# Patient Record
Sex: Female | Born: 1945
Health system: Southern US, Community
[De-identification: ages and names within clinical notes are randomized; demographics above are authoritative.]

## PROBLEM LIST (undated history)

## (undated) DIAGNOSIS — M8580 Other specified disorders of bone density and structure, unspecified site: Secondary | ICD-10-CM

## (undated) DIAGNOSIS — E079 Disorder of thyroid, unspecified: Secondary | ICD-10-CM

## (undated) DIAGNOSIS — M1990 Unspecified osteoarthritis, unspecified site: Secondary | ICD-10-CM

## (undated) DIAGNOSIS — Z720 Tobacco use: Secondary | ICD-10-CM

## (undated) HISTORY — DX: Other specified disorders of bone density and structure, unspecified site: M85.80

## (undated) HISTORY — PX: TUBAL LIGATION: SHX77

## (undated) HISTORY — DX: Unspecified osteoarthritis, unspecified site: M19.90

## (undated) HISTORY — DX: Disorder of thyroid, unspecified: E07.9

## (undated) HISTORY — PX: EYE SURGERY: SHX253

## (undated) HISTORY — DX: Tobacco use: Z72.0

## (undated) HISTORY — PX: TONSILLECTOMY AND ADENOIDECTOMY: SHX28

---

## 2004-08-03 ENCOUNTER — Ambulatory Visit: Payer: Self-pay | Admitting: Internal Medicine

## 2004-08-03 ENCOUNTER — Ambulatory Visit (HOSPITAL_COMMUNITY): Admission: RE | Admit: 2004-08-03 | Discharge: 2004-08-03 | Payer: Self-pay | Admitting: Internal Medicine

## 2004-08-08 ENCOUNTER — Ambulatory Visit: Payer: Self-pay | Admitting: Internal Medicine

## 2004-10-03 ENCOUNTER — Ambulatory Visit: Payer: Self-pay | Admitting: Internal Medicine

## 2004-10-05 ENCOUNTER — Ambulatory Visit: Payer: Self-pay | Admitting: Internal Medicine

## 2004-10-14 ENCOUNTER — Ambulatory Visit: Payer: Self-pay | Admitting: Internal Medicine

## 2004-11-17 ENCOUNTER — Ambulatory Visit: Payer: Self-pay | Admitting: Internal Medicine

## 2004-11-23 ENCOUNTER — Ambulatory Visit: Payer: Self-pay | Admitting: Internal Medicine

## 2005-01-16 ENCOUNTER — Ambulatory Visit: Payer: Self-pay | Admitting: Internal Medicine

## 2005-01-19 ENCOUNTER — Ambulatory Visit: Payer: Self-pay | Admitting: Internal Medicine

## 2005-02-16 ENCOUNTER — Ambulatory Visit: Payer: Self-pay | Admitting: Internal Medicine

## 2005-03-22 ENCOUNTER — Ambulatory Visit: Payer: Self-pay | Admitting: Internal Medicine

## 2005-04-10 HISTORY — PX: TOTAL HIP ARTHROPLASTY: SHX124

## 2005-08-17 ENCOUNTER — Ambulatory Visit: Payer: Self-pay | Admitting: Internal Medicine

## 2005-10-04 ENCOUNTER — Inpatient Hospital Stay (HOSPITAL_COMMUNITY): Admission: RE | Admit: 2005-10-04 | Discharge: 2005-10-08 | Payer: Self-pay | Admitting: Orthopedic Surgery

## 2006-05-30 ENCOUNTER — Ambulatory Visit: Payer: Self-pay | Admitting: Internal Medicine

## 2006-09-21 ENCOUNTER — Ambulatory Visit: Payer: Self-pay | Admitting: Internal Medicine

## 2006-11-29 ENCOUNTER — Ambulatory Visit: Payer: Self-pay | Admitting: Internal Medicine

## 2006-11-29 LAB — CONVERTED CEMR LAB
Potassium: 4.7 meq/L (ref 3.5–5.1)
TSH: 1.65 microintl units/mL (ref 0.35–5.50)

## 2007-02-28 DIAGNOSIS — M1990 Unspecified osteoarthritis, unspecified site: Secondary | ICD-10-CM | POA: Insufficient documentation

## 2007-02-28 DIAGNOSIS — M810 Age-related osteoporosis without current pathological fracture: Secondary | ICD-10-CM | POA: Insufficient documentation

## 2007-03-22 ENCOUNTER — Telehealth: Payer: Self-pay | Admitting: Internal Medicine

## 2007-03-29 ENCOUNTER — Ambulatory Visit: Payer: Self-pay | Admitting: Internal Medicine

## 2007-03-29 DIAGNOSIS — F17200 Nicotine dependence, unspecified, uncomplicated: Secondary | ICD-10-CM

## 2007-03-29 DIAGNOSIS — E039 Hypothyroidism, unspecified: Secondary | ICD-10-CM | POA: Insufficient documentation

## 2007-03-29 DIAGNOSIS — I10 Essential (primary) hypertension: Secondary | ICD-10-CM

## 2007-04-15 ENCOUNTER — Telehealth: Payer: Self-pay | Admitting: Internal Medicine

## 2007-04-18 ENCOUNTER — Ambulatory Visit (HOSPITAL_COMMUNITY): Admission: RE | Admit: 2007-04-18 | Discharge: 2007-04-18 | Payer: Self-pay | Admitting: Internal Medicine

## 2007-04-18 ENCOUNTER — Encounter: Payer: Self-pay | Admitting: Internal Medicine

## 2007-05-03 ENCOUNTER — Encounter: Admission: RE | Admit: 2007-05-03 | Discharge: 2007-05-03 | Payer: Self-pay | Admitting: Internal Medicine

## 2007-05-11 IMAGING — CR DG HIP 1V PORT*R*
1 series · 1 of 1 positions shown · non-contrast
Comparison: 08/03/05.

CLINICAL DATA: Post right total hip replacement.
 PORTABLE RIGHT HIP ? 1 VIEW:

[view not recorded]
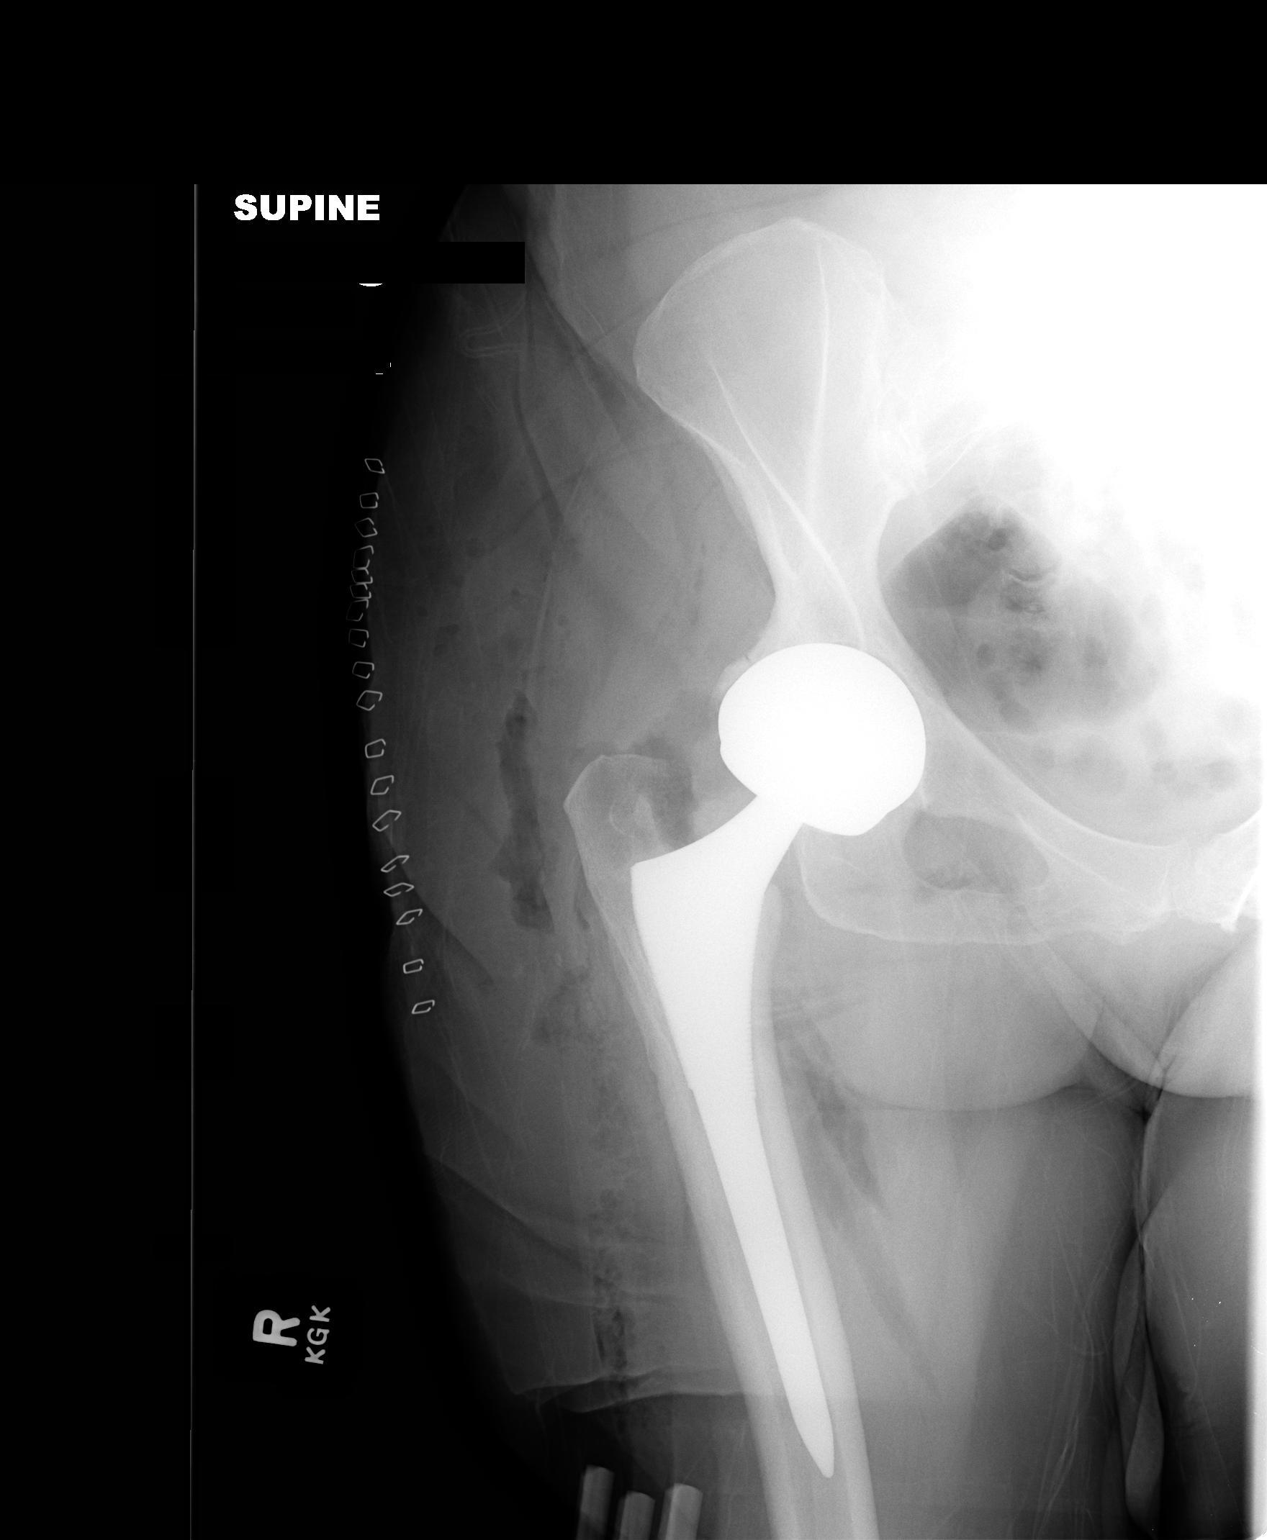

[1 of 1 positions shown; findings below may reference images not displayed]

FINDINGS: Status post right total hip replacement.  Good position and alignment of the prosthesis and surrounding bony structures, in the AP projection.
IMPRESSION: Post right total hip replacement ? good position and alignment in one view.

## 2007-06-20 ENCOUNTER — Telehealth: Payer: Self-pay | Admitting: Internal Medicine

## 2007-06-27 ENCOUNTER — Ambulatory Visit: Payer: Self-pay | Admitting: Internal Medicine

## 2007-06-27 DIAGNOSIS — E785 Hyperlipidemia, unspecified: Secondary | ICD-10-CM

## 2007-08-26 ENCOUNTER — Ambulatory Visit: Payer: Self-pay | Admitting: Internal Medicine

## 2008-01-28 ENCOUNTER — Encounter: Payer: Self-pay | Admitting: Internal Medicine

## 2008-02-17 ENCOUNTER — Ambulatory Visit: Payer: Self-pay | Admitting: Internal Medicine

## 2008-02-17 LAB — CONVERTED CEMR LAB
Cholesterol: 180 mg/dL (ref 0–200)
HDL: 62.5 mg/dL (ref >39.0)
Triglycerides: 105 mg/dL (ref 0–149)
VLDL: 21 mg/dL (ref 0–40)

## 2008-02-24 ENCOUNTER — Ambulatory Visit: Payer: Self-pay | Admitting: Internal Medicine

## 2008-04-22 ENCOUNTER — Ambulatory Visit: Payer: Self-pay | Admitting: Diagnostic Radiology

## 2008-04-22 ENCOUNTER — Ambulatory Visit: Payer: Self-pay | Admitting: Internal Medicine

## 2008-04-22 ENCOUNTER — Ambulatory Visit (HOSPITAL_BASED_OUTPATIENT_CLINIC_OR_DEPARTMENT_OTHER): Admission: RE | Admit: 2008-04-22 | Discharge: 2008-04-22 | Payer: Self-pay | Admitting: Internal Medicine

## 2008-04-23 ENCOUNTER — Encounter: Payer: Self-pay | Admitting: Internal Medicine

## 2008-06-23 ENCOUNTER — Ambulatory Visit: Payer: Self-pay | Admitting: Internal Medicine

## 2008-06-23 DIAGNOSIS — L259 Unspecified contact dermatitis, unspecified cause: Secondary | ICD-10-CM | POA: Insufficient documentation

## 2008-07-16 ENCOUNTER — Ambulatory Visit: Payer: Self-pay | Admitting: *Deleted

## 2008-07-17 ENCOUNTER — Telehealth (INDEPENDENT_AMBULATORY_CARE_PROVIDER_SITE_OTHER): Payer: Self-pay | Admitting: *Deleted

## 2008-07-22 ENCOUNTER — Ambulatory Visit: Payer: Self-pay | Admitting: Internal Medicine

## 2008-08-20 ENCOUNTER — Telehealth: Payer: Self-pay | Admitting: Internal Medicine

## 2008-09-21 ENCOUNTER — Encounter: Payer: Self-pay | Admitting: Internal Medicine

## 2008-12-24 ENCOUNTER — Ambulatory Visit: Payer: Self-pay | Admitting: Internal Medicine

## 2008-12-24 LAB — CONVERTED CEMR LAB: TSH: 1.112 microintl units/mL (ref 0.350–4.500)

## 2008-12-25 ENCOUNTER — Telehealth: Payer: Self-pay | Admitting: Internal Medicine

## 2009-06-10 ENCOUNTER — Ambulatory Visit: Payer: Self-pay | Admitting: Internal Medicine

## 2009-06-10 LAB — CONVERTED CEMR LAB
AST: 17 units/L (ref 0–37)
Bilirubin, Direct: 0.1 mg/dL (ref 0.0–0.3)
CO2: 23 meq/L (ref 19–32)
Calcium: 9.8 mg/dL (ref 8.4–10.5)
Creatinine, Ser: 0.83 mg/dL (ref 0.40–1.20)
Glucose, Bld: 129 mg/dL — ABNORMAL HIGH (ref 70–99)
Indirect Bilirubin: 0.6 mg/dL (ref 0.0–0.9)
LDL Cholesterol: 127 mg/dL — ABNORMAL HIGH (ref 0–99)
Sodium: 139 meq/L (ref 135–145)
TSH: 1.758 microintl units/mL (ref 0.350–4.500)
Total CHOL/HDL Ratio: 3.5

## 2009-06-15 ENCOUNTER — Ambulatory Visit: Payer: Self-pay | Admitting: Internal Medicine

## 2009-06-15 DIAGNOSIS — R7309 Other abnormal glucose: Secondary | ICD-10-CM | POA: Insufficient documentation

## 2009-06-23 ENCOUNTER — Encounter: Payer: Self-pay | Admitting: Internal Medicine

## 2009-09-13 ENCOUNTER — Ambulatory Visit (HOSPITAL_BASED_OUTPATIENT_CLINIC_OR_DEPARTMENT_OTHER): Admission: RE | Admit: 2009-09-13 | Discharge: 2009-09-13 | Payer: Self-pay | Admitting: Internal Medicine

## 2009-09-13 ENCOUNTER — Ambulatory Visit: Payer: Self-pay | Admitting: Diagnostic Radiology

## 2009-12-29 ENCOUNTER — Telehealth: Payer: Self-pay | Admitting: Internal Medicine

## 2010-01-12 ENCOUNTER — Encounter: Payer: Self-pay | Admitting: Internal Medicine

## 2010-01-12 LAB — CONVERTED CEMR LAB
BUN: 10 mg/dL (ref 6–23)
Calcium: 10 mg/dL (ref 8.4–10.5)
Cholesterol: 186 mg/dL (ref 0–200)
Potassium: 5 meq/L (ref 3.5–5.3)
Triglycerides: 94 mg/dL (ref <150)
VLDL: 19 mg/dL (ref 0–40)

## 2010-01-20 ENCOUNTER — Encounter: Payer: Self-pay | Admitting: Internal Medicine

## 2010-01-20 ENCOUNTER — Ambulatory Visit: Payer: Self-pay | Admitting: Internal Medicine

## 2010-05-01 ENCOUNTER — Encounter: Payer: Self-pay | Admitting: Internal Medicine

## 2010-05-10 NOTE — Letter (Signed)
Summary: [facility]: Immunoassay Fecal Occult Blood (iFOB) Order Psychologist, counselling at [facility]  [address]   [place], [place] [address]   Phone: [phone]  Fax: [phone]      [facility]: Immunoassay Fecal Occult Blood (iFOB) Order Form   January 20, 2010 MRN: [id number]   SHENIYA GARCIAPEREZ March 07, 1946   Physicican Name:Dr Thomos Lemons  Diagnosis Code:V70.0      Glendell Docker CMA  Appended Document: [facility]: Immunoassay Fecal Occult Blood (iFOB) Order Form test not performed

## 2010-05-10 NOTE — Assessment & Plan Note (Signed)
Summary: 3 MONTH FOLLOW UP APPT PAST DUE/DK   Vital Signs:  Patient profile:   65 year old female Height:      61 inches Weight:      138.75 pounds BMI:     26.31 O2 Sat:      99 % on Room air Temp:     98.1 degrees F oral Pulse rate:   73 / minute Pulse rhythm:   regular Resp:     16 per minute BP sitting:   120 / 80  (left arm) Cuff size:   regular  Vitals Entered By: Glendell Docker CMA (January 20, 2010 10:46 AM)  O2 Flow:  Room air  Contraindications/Deferment of Procedures/Staging:    Test/Procedure: FLU VAX    Reason for deferment: patient declined  CC: 3 Month Follow up Is Patient Diabetic? No Pain Assessment Patient in pain? no        Primary Care Terra Aveni:  Dondra Spry DO  CC:  3 Month Follow up.  History of Present Illness:  Hypertension Follow-Up      This is a 65 year old woman who presents for Hypertension follow-up.  The patient denies lightheadedness.  Compliance with medications (by patient report) has been near 100%.  The patient reports that dietary compliance has been fair.    Preventive Screening-Counseling & Management  Alcohol-Tobacco     Smoking Status: current     Smoking Cessation Counseling: yes     Smoke Cessation Stage: precontemplative  Allergies: No Known Drug Allergies  Past History:  Past Medical History: Osteoarthritis-Rt hip Osteopenia  Hypothyroidism     Tobacco Abuse    Past Surgical History: T&A Rt Hip replacement-2007 Tubal ligation        PMH reviewed for relevance  Family History: Adopted      Social History: Current Smoker Single - 4 children   Alcohol use-yes  (socially)    Lives alone Previously worked at [employer] 1  Physical Exam  General:  alert, well-developed, and well-nourished.   Ears:  R ear normal and L ear normal.   Lungs:  normal respiratory effort.  faint exp wheeze Heart:  normal rate, regular rhythm, and no gallop.   Extremities:  No lower extremity edema    Impression &  Recommendations:  Problem # 1:  HYPERTENSION (ICD-401.9) Assessment Unchanged  Her updated medication list for this problem includes:    Amlodipine Besylate 5 Mg Tabs (Amlodipine besylate) ..... One by mouth once daily  BP today: 120/80 Prior BP: 130/70 (06/15/2009)  Labs Reviewed: K+: 5.0 (01/12/2010) Creat: : 0.78 (01/12/2010)   Chol: 186 (01/12/2010)   HDL: 74 (01/12/2010)   LDL: 93 (01/12/2010)   TG: 94 (01/12/2010)  Problem # 2:  HYPOTHYROIDISM (ICD-244.9)  Her updated medication list for this problem includes:    Levothyroxine Sodium 100 Mcg Tabs (Levothyroxine sodium) ..... One by mouth once daily  Problem # 3:  DIABETES MELLITUS, TYPE II, BORDERLINE (ICD-790.29) Pt counseled on diet and exercise.  Labs Reviewed: Creat: 0.78 (01/12/2010)     Problem # 4:  TOBACCO ABUSE (ICD-305.1)  Encouraged smoking cessation and discussed different methods for smoking cessation.  time spent counseling pt - 5 mins  Problem # 5:  PREVENTIVE HEALTH CARE (ICD-V70.0)  Orders: Gastroenterology Referral (GI)  Complete Medication List: 1)  Levothyroxine Sodium 100 Mcg Tabs (Levothyroxine sodium) .... One by mouth once daily 2)  Amlodipine Besylate 5 Mg Tabs (Amlodipine besylate) .... One by mouth once daily 3)  Low-dose  Aspirin 81 Mg Tabs (Aspirin) .... Take 1 tablet by mouth once a day 4)  Multivitamins Tabs (Multiple vitamin) .... Take 1 tablet by mouth once a day 5)  [facility] Calcium-600 600 Mg Tabs (Calcium carbonate) .... Take 1 tablet by mouth once a day  Patient Instructions: 1)  Please schedule a follow-up appointment in 6 months. Prescriptions: AMLODIPINE BESYLATE 5 MG  TABS (AMLODIPINE BESYLATE) one by mouth once daily  #90 x 1   Entered and Authorized by:   D. Thomos Lemons DO   Signed by:   D. Thomos Lemons DO on 01/20/2010   Method used:   Electronically to        [facility]. [phone]* (retail)       [address], [place]  [address]       Ph: [phone]        Fax: [phone]   RxID:   [id number] LEVOTHYROXINE SODIUM 100 MCG TABS (LEVOTHYROXINE SODIUM) one by mouth once daily  #90 x 1   Entered and Authorized by:   D. Thomos Lemons DO   Signed by:   D. Thomos Lemons DO on 01/20/2010   Method used:   Electronically to        [facility]. [phone]* (retail)       [address], [place]  [address]       Ph: [phone]       Fax: [phone]   RxID:   [id number]    Preventive Care Screening  Last Flu Shot:    Date:  01/20/2010    Results:  Declined

## 2010-05-10 NOTE — Progress Notes (Signed)
Summary: Medication Refills  Phone Note Outgoing Call   Call placed by: Glendell Docker CMA,  December 29, 2009 11:35 AM Call placed to: Patient Action Taken: Appt scheduled Summary of Call: call placed to patient regarding medication refills. She was reminded that she is past due for  follow up with blood work. Patient has cancelled last 2 scheduled appointments. She states she was not aware that she was due for follow up. Appointment has been scheduled for 01/20/2010  with Dr Artist Pais @ 10:30 am. She was advised to return one week prior to scheduled appointment for fasting blood work. Patient has verbalized understanding and agrees. Refills on Amlodipine and Levothyroxine have been sent to pharmacy for one refill. Initial call taken by: Glendell Docker CMA,  December 29, 2009 11:39 AM     Appended Document: Orders Update    Clinical Lists Changes  Orders: Added new Test order of T-Basic Metabolic Panel [phone]) - Signed Added new Test order of T-Lipid Profile [id number]) - Signed Added new Test order of T- Hemoglobin A1C ([id number]) - Signed Added new Test order of T-TSH ([id number]) - Signed

## 2010-05-10 NOTE — Assessment & Plan Note (Signed)
Summary: Follow up/MHF   Vital Signs:  Patient profile:   65 year old female Height:      61 inches Weight:      147.13 pounds BMI:     27.90 O2 Sat:      100 % on Room air Temp:     97.9 degrees F oral Pulse rate:   77 / minute Pulse rhythm:   regular Resp:     18 per minute BP sitting:   130 / 70  (right arm) Cuff size:   regular  Vitals Entered By: Glendell Docker CMA (June 15, 2009 11:04 AM)  O2 Flow:  Room air CC: Follow up Is Patient Diabetic? No Comments Refill on levothyroxine and amlodipine   Primary Care Provider:  Dondra Spry DO  CC:  Follow up.  History of Present Illness: 65 y/o with hx htn, hypothyroidism and tob abuse for follow up some weight gain since prev visit not following low salt diet overall doing well  reviewed labs - glucose 129.  she was fasting.  fam hx unknown - she was adopted she likes carbs,  drinks a lot of cranberry cocktail juice  Preventive Screening-Counseling & Management  Alcohol-Tobacco     Alcohol drinks/day: 1-2 per week     Smoking Status: current  Caffeine-Diet-Exercise     Caffeine use/day: 4 -5 per day     Does Patient Exercise: no  Allergies (verified): No Known Drug Allergies  Past History:  Past Medical History: Osteoarthritis-Rt hip Osteopenia Hypothyroidism     Tobacco Abuse    Past Surgical History: T&A Rt Hip replacement-2007 Tubal ligation        Family History: Adopted    Social History: Current Smoker Single - 4 children   Alcohol use-yes  (socially)     Caffeine use/day:  4 -5 per day  Physical Exam  General:  alert, well-developed, and well-nourished.   Head:  normocephalic and atraumatic.   Ears:  R ear normal and L ear normal.   Mouth:  Oral mucosa and oropharynx without lesions or exudates.  Teeth in good repair. Neck:  supple and no masses.   Lungs:  normal respiratory effort and normal breath sounds.   Heart:  normal rate, regular rhythm, and no gallop.   Abdomen:   soft, non-tender, normal bowel sounds, no masses, no hepatomegaly, and no splenomegaly.   Extremities:  No lower extremity edema  Neurologic:  cranial nerves II-XII intact and gait normal.   Psych:  normally interactive and good eye contact.     Impression & Recommendations:  Problem # 1:  HYPERTENSION (ICD-401.9) stable. I encouraged lower salt intake  Her updated medication list for this problem includes:    Amlodipine Besylate 5 Mg Tabs (Amlodipine besylate) ..... One by mouth once daily  BP today: 130/70 Prior BP: 112/70 (12/24/2008)  Labs Reviewed: K+: 4.9 (06/10/2009) Creat: : 0.83 (06/10/2009)   Chol: 223 (06/10/2009)   HDL: 64 (06/10/2009)   LDL: 127 (06/10/2009)   TG: 160 (06/10/2009)  Problem # 2:  HYPOTHYROIDISM (ICD-244.9) Stable.  Maintain current medication regimen.  Her updated medication list for this problem includes:    Levothyroxine Sodium 100 Mcg Tabs (Levothyroxine sodium) ..... One by mouth once daily  Labs Reviewed: TSH: 1.758 (06/10/2009)    Chol: 223 (06/10/2009)   HDL: 64 (06/10/2009)   LDL: 127 (06/10/2009)   TG: 160 (06/10/2009)  Problem # 3:  DIABETES MELLITUS, TYPE II, BORDERLINE (ICD-790.29) Fasting blood sugar 129.  I encouraged diet and exercise.   A1c before next OV  Complete Medication List: 1)  Levothyroxine Sodium 100 Mcg Tabs (Levothyroxine sodium) .... One by mouth once daily 2)  Amlodipine Besylate 5 Mg Tabs (Amlodipine besylate) .... One by mouth once daily 3)  Low-dose Aspirin 81 Mg Tabs (Aspirin) .... Take 1 tablet by mouth once a day 4)  Multivitamins Tabs (Multiple vitamin) .... Take 1 tablet by mouth once a day 5)  [facility] Calcium-600 600 Mg Tabs (Calcium carbonate) .... Take 1 tablet by mouth once a day  Patient Instructions: 1)  Please schedule a follow-up appointment in 3 months. 2)  Avoid concentrated sweets and decrease carbohydrates 3)  Start walking program as discussed 4)  BMP prior to visit, ICD-9:  790.29 5)  HbgA1C  prior to visit, ICD-9: 790.29 6)  Lipid Panel prior to visit, ICD-9: 790.29 7)  Please return for lab work one (1) week before your next appointment.  Prescriptions: AMLODIPINE BESYLATE 5 MG  TABS (AMLODIPINE BESYLATE) one by mouth once daily  #30 x 5   Entered and Authorized by:   D. Thomos Lemons DO   Signed by:   D. Thomos Lemons DO on 06/15/2009   Method used:   Electronically to        [facility]. [phone]* (retail)       [address] [place], [place]  [address]       Ph: [phone]       Fax: [phone]   RxID:   [id number] LEVOTHYROXINE SODIUM 100 MCG TABS (LEVOTHYROXINE SODIUM) one by mouth once daily  #30 x 5   Entered and Authorized by:   D. Thomos Lemons DO   Signed by:   D. Thomos Lemons DO on 06/15/2009   Method used:   Electronically to        [facility]. [phone]* (retail)       [address], [place]  [address]       Ph: [phone]       Fax: [phone]   RxID:   [id number]    Immunization History:  Influenza Immunization History:    Influenza:  declined (06/15/2009)  Tetanus/Td Immunization History:    Tetanus/Td:  historical (06/20/2005)   Contraindications/Deferment of Procedures/Staging:    Test/Procedure: FLU VAX    Reason for deferment: patient declined    Current Allergies (reviewed today): No known allergies   Appended Document: Orders Update    Clinical Lists Changes  Orders: Added new Referral order of Nutrition Referral (Nutrition) - Signed

## 2010-05-10 NOTE — Letter (Signed)
Summary: [facility]  [facility]   Imported By: Lanelle Bal 08/20/2009 11:22:39  _____________________________________________________________________  External Attachment:    Type:   Image     Comment:   External Document

## 2010-07-11 ENCOUNTER — Ambulatory Visit: Payer: Self-pay | Admitting: Internal Medicine

## 2010-08-10 ENCOUNTER — Encounter: Payer: Self-pay | Admitting: Internal Medicine

## 2010-08-16 ENCOUNTER — Telehealth: Payer: Self-pay | Admitting: Internal Medicine

## 2010-08-16 NOTE — Telephone Encounter (Signed)
Call returned to patient at  [phone], she was informed that it was okay to

## 2010-08-16 NOTE — Telephone Encounter (Signed)
I called pt to remind her of her appt for tomorrow morning. Patient would like to know if she needs to take her thyroid med(levothyroxine) before her appt? Please assist.

## 2010-08-17 ENCOUNTER — Encounter: Payer: Self-pay | Admitting: Internal Medicine

## 2010-08-17 ENCOUNTER — Ambulatory Visit: Payer: Medicare Other | Admitting: Internal Medicine

## 2010-08-17 DIAGNOSIS — M949 Disorder of cartilage, unspecified: Secondary | ICD-10-CM

## 2010-08-17 DIAGNOSIS — E039 Hypothyroidism, unspecified: Secondary | ICD-10-CM

## 2010-08-17 DIAGNOSIS — I10 Essential (primary) hypertension: Secondary | ICD-10-CM

## 2010-08-17 DIAGNOSIS — M899 Disorder of bone, unspecified: Secondary | ICD-10-CM

## 2010-08-17 NOTE — Progress Notes (Signed)
  Subjective:    Patient ID: Jill Armstrong, female    DOB: 1945-09-19, 65 y.o.   MRN: [id number]  HPI  65 y/o white female with hx of hypothyroidism, Review of Systems     Past Medical History  Diagnosis Date  . Arthritis     osteoarthritis right hip  . Osteopenia   . Tobacco abuse   . Thyroid disease     hypothyroidism    History   Social History  . Marital Status: Divorced    Spouse Name: N/A    Number of Children: 4  . Years of Education: N/A   Occupational History  . Not on file.   Social History Main Topics  . Smoking status: Current Everyday Smoker  . Smokeless tobacco: Not on file  . Alcohol Use: Yes     socially  . Drug Use: Not on file  . Sexually Active: Not on file   Other Topics Concern  . Not on file   Social History Narrative  . No narrative on file    Past Surgical History  Procedure Date  . Tubal ligation   . Total hip arthroplasty 2007    right hip  . Tonsillectomy and adenoidectomy     Family History  Problem Relation Age of Onset  . Adopted: Yes    No Known Allergies  Current Outpatient Prescriptions on File Prior to Visit  Medication Sig Dispense Refill  . amLODipine (NORVASC) 5 MG tablet Take 5 mg by mouth daily.        Marland Kitchen aspirin 81 MG tablet Take 81 mg by mouth daily.        . calcium carbonate (OS-CAL) 600 MG TABS Take 600 mg by mouth daily.        Marland Kitchen levothyroxine (SYNTHROID, LEVOTHROID) 100 MCG tablet Take 100 mcg by mouth daily.        . Multiple Vitamins-Minerals (MULTIVITAMIN WITH MINERALS) tablet Take 1 tablet by mouth daily.          BP 114/70  Pulse 66  Temp(Src) 97.9 F (36.6 C) (Oral)  Resp 18  Ht 5\' 1"  (1.549 m)  Wt 140 lb (63.504 kg)  BMI 26.45 kg/m2  SpO2 100%    Objective:   Physical Exam     Constitutional: Appears well-developed and well-nourished. No distress.  Cardiovascular: Normal rate, regular rhythm and normal heart sounds.  Exam reveals no gallop and no friction rub.   No murmur  heard. Pulmonary/Chest: Effort normal and breath sounds normal.  No wheezes. No rales.  Neurological: Alert. No cranial nerve deficit.  Skin: Skin is warm and dry.  Psychiatric: Normal mood and affect. Behavior is normal.      Assessment & Plan:

## 2010-08-18 LAB — BASIC METABOLIC PANEL WITH GFR
BUN: 13 mg/dL (ref 6–23)
CO2: 22 mEq/L (ref 19–32)
Calcium: 10.4 mg/dL (ref 8.4–10.5)
Chloride: 103 mEq/L (ref 96–112)
Glucose, Bld: 108 mg/dL — ABNORMAL HIGH (ref 70–99)
Potassium: 5.2 mEq/L (ref 3.5–5.3)
Sodium: 140 mEq/L (ref 135–145)

## 2010-08-19 ENCOUNTER — Telehealth: Payer: Self-pay | Admitting: Internal Medicine

## 2010-08-19 MED ORDER — AMLODIPINE BESYLATE 5 MG PO TABS: 5.0000 mg | ORAL_TABLET | Freq: Every day | ORAL | Status: DC

## 2010-08-19 MED ORDER — LEVOTHYROXINE SODIUM 100 MCG PO TABS: 100.0000 ug | ORAL_TABLET | Freq: Every day | ORAL | Status: DC

## 2010-08-19 NOTE — Telephone Encounter (Signed)
Call placed to [facility], spoke with Darl Pikes, she has verified that Rx's have been received by pharmacy.  Call was placed to patient at [phone], she was informed Rx's have been received by pharmacy, and advised to check with pharmacy for medication refill.

## 2010-08-19 NOTE — Telephone Encounter (Signed)
levothyroxin 100 mcg and amlodipine 5mg   Please forward these to [facility] [address] in [place]

## 2010-08-26 NOTE — H&P (Signed)
Jill Armstrong, Jill Armstrong              ACCOUNT NO.:  [id number]   MEDICAL RECORD NO.:  [id number]          PATIENT TYPE:  INP   LOCATION:  NA                           FACILITY:  [facility]   PHYSICIAN:  Georges Lynch. Gioffre, M.D.DATE OF BIRTH:  1946/01/07   DATE OF ADMISSION:  10/04/2005  DATE OF DISCHARGE:                                HISTORY & PHYSICAL   CHIEF COMPLAINT:  Painful range of motion, right hip.   HISTORY OF PRESENT ILLNESS:  The patient is a 65 year old female who has had  progressively worsening right hip pain with ambulation with loss of range of  motion who has also noted some leg length issues.  X-rays show that she has  severe osteoarthritis of the right hip with partial lateral subluxation.  She has large cystic changes throughout the acetabulum and femoral head.   ALLERGIES:  No known drug allergies.   CURRENT MEDICATIONS:  1.  Synthroid 88 mcg daily.  2.  Multivitamins.  3.  Calcium.   PAST MEDICAL HISTORY:  1.  Hypothyroidism.  2.  Osteoporosis.  3.  Arthritis of the right hip.   PAST SURGICAL HISTORY:  1.  Tonsils and adenoids removed in 1952.  2.  Tubes tied in 1976.   The patient denies any complications to the above mentioned surgical  procedures.   SOCIAL HISTORY:  The patient is single.  Works as a Tour manager  1.  Smokes one pack a day, drinks socially, has four children.  Lives in an  apartment.  Will be going to stay with her daughter up in [place]. [place] after the  surgery.   FAMILY HISTORY:  Unknown as she is adopted.   PRIMARY CARE PHYSICIAN:  Dr. Philis Fendt With [facility].   PHYSICAL EXAMINATION:  VITAL SIGNS:  Height 5 feet 1 inches, weight 142  pounds, blood pressure 138/76, pulse 76, respirations 12, afebrile.  GENERAL APPEARANCE:  This is a healthy appearing, well-developed 65 year old  female, conscious, alert and appropriate, ambulates with a right sided limp.  She easily gets herself on and off the exam table and appears  to be in no  extreme distress.  HEENT:  Normocephalic.  Pupils equal, round and reactive to light.  Extraocular movements intact.  Oropharynx intact.  NECK:  Supple.  No palpable lymphadenopathy.  Thyroid regions were  nontender.  She had good range of motion of her cervical spine.  CHEST:  Lung sounds were clear and equal bilaterally.  No wheezes, rales or  rhonchi.  HEART:  Regular rate and rhythm.  No murmurs, rubs or gallops.  ABDOMEN:  Soft, nontender.  Bowel sounds normoactive.  CVA region was  nontender.  EXTREMITIES:  Upper extremities were symmetrically sized and shaped.  She  had good range of motion of her shoulders, elbows and wrists.  Motor  strength was 5/5.   Lower extremities:  Left hip had full extension/flexion up to 130 with 20-30  degrees internal/external rotation without any discomfort.  Right hip had  full extension/flexion up to 90 degrees with 15 degrees of internal  rotation, 0 degrees of  external rotation with pain in the groin.  Bilateral  knees were symmetrical with full extension/flexion, back to 130 degrees.  No  instability.  No swelling.  No signs of infection.  Calves are soft and  nontender.  The ankles are symmetrical with good dorsoplantar flexion.   Peripheral vascular:  Carotid pulses were 2+, radial pulses were 2+,  dorsalis pedis pulses were 1+.  She had a few varicosities in the lower  extremities, but no peripheral edema or venostasis changes.  NEUROLOGICAL:  The patient was conscious, alert and appropriate.  Good  historian.  No gross or neurologic defects noted.  BREASTS/RECTAL/GU:  Deferred at this time.   IMPRESSION:  1.  End-stage osteoarthritis, right hip.  2.  Thyroid disease.  3.  Osteoporosis.  4.  Smoker with one pack a day.   PLAN:  The patient will be admitted to [facility] on June 27 under  the care of Dr. Ranee Gosselin.  Will have a right total hip arthroplasty,  metal on metal with DePuy system.  The patient  will undergo all the routine  labs and tests prior to that procedure.      Jamelle Rushing, P.A.    ______________________________  Georges Lynch Darrelyn Hillock, M.D.    RWK/MEDQ  D:  09/25/2005  T:  09/25/2005  Job:  [id number]

## 2010-08-26 NOTE — Discharge Summary (Signed)
Jill Armstrong, Jill Armstrong              ACCOUNT NO.:  [id number]   MEDICAL RECORD NO.:  [id number]          PATIENT TYPE:  INP   LOCATION:  1603                         FACILITY:  [facility]   PHYSICIAN:  Georges Lynch. Gioffre, M.D.DATE OF BIRTH:  1945/06/24   DATE OF ADMISSION:  10/04/2005  DATE OF DISCHARGE:  10/08/2005                                 DISCHARGE SUMMARY   ADMITTING DIAGNOSES:  1.  End-stage osteoarthritis right hip.  2.  Thyroid disease.  3.  Osteoporosis.  4.  Tobacco smoker.   DISCHARGE DIAGNOSES:  1.  Right total hip arthroplasty.  2.  Postoperative blood loss anemia, asymptomatic vital signs, allowed to      self correct with p.o. supplements.  3.  Thyroid disease.  4.  Osteoporosis.  5.  History of tobacco smoker.   HISTORY OF PRESENT ILLNESS:  Patient is a 65 year old female with  progressively worsening right hip pain with ambulation, loss of range of  motion, also noted leg length issues.  X-rays show severe osteoarthritis of  the right hip with partial lateral subluxation, cystic changes in the  acetabulum and femoral head.  Elected to proceed with a total hip  arthroplasty.   ALLERGIES:  No known drug allergies.   CURRENT MEDICATIONS:  1.  Synthroid 88 mcg a day.  2.  Multivitamins.  3.  Calcium.   SURGICAL PROCEDURE:  On October 04, 2005 patient was taken to the OR by Dr. Worthy Rancher, assisted by Dr. Lajoyce Corners.  Under general anesthesia underwent a  right total hip arthroplasty without any complications.  Femoral head was  sent to pathology.  Estimated blood loss 250 mL.  Patient developed no  complications.  Was transferred to the recovery room, then to the orthopedic  floor in good condition.  The following components were implanted.  A size 4  high offset Duofix stem, a size 46 femoral head, size +5 neck length, size  52 mm acetabular cup.   CONSULTS:  Routine consults requested physical therapy, occupational  therapy, case management.   HOSPITAL  COURSE:  On June 27 patient was admitted to [facility] under Dr.  Jeannetta Ellis service.  Was taken to the OR where right total hip arthroplasty  was performed.  Patient then occurred four postoperative days on the  orthopedic floor without any complications following a total hip protocol.  Patient was brought to the floor with IV antibiotics, started on heparin  subcutaneous and then Coumadin p.o. for DVT prophylaxis and IV pain  medicines.  Patient was transitioned over to p.o. medications well.  Patient's wound remained benign for any signs of infection.  Her leg  remained neuromotor vascularly intact throughout her stay.  __________  hemoglobin dropped from 11.1 the first postoperative day down to 9.4 over  two days.  Her vital signs remained stable.  The patient tolerated this so  it was allowed to self correct over time with just p.o. supplements.  The  patient worked well with physical therapy, was to be going home with her  daughter to live in [place] so arrangements were made on  postoperative  day #4 for outpatient home health physical therapy, Coumadin monitoring and  the patient was discharged good condition.   LABORATORIES:  H&H on June 30 is 9.4/27.7.  PT and INR on July 1 PT is 18.4  with an INR of 1.5.  Routine chemistries on June 28 found sodium 137,  potassium 4.1, glucose 143, BUN 5, creatinine 0.7.  Elevated glucose was  felt to be routine postoperative stress and activity.  It was allowed to  self correct and monitor without any further monitoring.  EKG on admission  was normal sinus rhythm, 71 beats per minute.  Chest x-ray on admission  showed no active disease.  Postoperative films of her hips showed total hip  replacement in good position and alignment.   MEDICATIONS UPON DISCHARGE FROM ORTHO FLOOR:  1.  Robaxin 500 mg p.o. q.6h. p.r.n.  2.  Heparin 5000 units subcutaneous q.12h. until Coumadin therapeutic.  3.  Ferrous sulfate 325 mg p.o. t.i.d.  4.  Percocet  one or two tablets every four to six hours p.r.n.  5.  Reglan 10 mg p.o. q.8h. p.r.n.  6.  Phenergan 25 mg p.o. q.6h. p.r.n.  7.  Synthroid 88 mcg p.o. daily.  8.  Multivitamins one tablet a day.  9.  Calcium 500 mg one tablet twice a day.  10. Coumadin 8 mg a day and adjusted by pharmacy.   DISCHARGE INSTRUCTIONS:   DIET:  No restrictions.   ACTIVITY:  Walk with the assistance of a walker.   WOUND CARE:  Patient is to change the dressing daily.   MEDICATIONS:  1.  Patient is to take Coumadin 5 mg tablets one and a half tablets a day      unless instructed to change by pharmacy.  2.  Percocet as written on the prescription for pain.   FOLLOW-UP:  The patient has a follow-up appointment Dr. Darrelyn Hillock two weeks  from the date of surgery.  Patient is to call [phone] extension [phone] for an  appointment.   Outpatient home health physical therapy and R.N. for Coumadin monitoring by  [facility].  Pro time on Monday, July 2.   CONDITION ON DISCHARGE:  Listed improved and good.      Jamelle Rushing, P.A.    ______________________________  Georges Lynch Darrelyn Hillock, M.D.    RWK/MEDQ  D:  11/07/2005  T:  11/07/2005  Job:  [id number]

## 2010-08-26 NOTE — Op Note (Signed)
NAMEMARCELL, PFEIFER              ACCOUNT NO.:  [id number]   MEDICAL RECORD NO.:  [id number]          PATIENT TYPE:  INP   LOCATION:  0005                         [facility]ACILITY[facility]  [facility]   PHYSICIAN:  Georges Lynch. Gioffre, M.D.DATE OF BIRTH:  December 03, 1945   DATE OF PROCEDURE:  10/04/2005  DATE OF DISCHARGE:                                 OPERATIVE REPORT   SURGEON:  Georges Lynch. Darrelyn Hillock, M.D.   ASSISTANT:  Madlyn Frankel. Charlann Boxer, M.D.   PREOPERATIVE DIAGNOSIS:  Degenerative arthritis with lateral subluxation of  the right hip with 1/2 inch shortening of the right lower extremity as  compared to the left.   POSTOPERATIVE DIAGNOSIS:  Degenerative arthritis with lateral subluxation of  the right hip with 1/2 inch shortening of the right lower extremity as  compared to the left.   OPERATION:  Right total hip arthroplasty utilizing the DePuy system.  I  utilized a 52 mm ASR cup, the tapered stem was Duofix femoral component size  4 high offset.  We used a +5 C tapered sleeve.  We utilized a size 46 mm  femoral head.  This was metal on metal.   PROCEDURE:  Under general anesthesia, routine orthopedic prepping and  draping of the right hip was carried out.  The patient had 1 gram of IV  Ancef preop.  A posterior lateral approach of the hip was carried out.  Bleeders were identified and cauterized.  At this time, self-retaining  retractors were inserted.  Bleeders were identified and cauterized.  I then  went down and detached the piriformis and the proximal rotators and then  protected the sciatic nerve.  I identified the capsule, did a capsulectomy,  dislocated the head, and then went through the appropriate measurements and  I did utilize a guide for the femoral neck cut.  Following that, we did our  lateral reaming for the greater trochanter and then reamed up to a size 4  stem.  We then rasped to a size 4.  Once the femur was prepared, we  inspected the calcar and made sure there were no fractures  and there were  none.  We then went down and completed a capsulectomy, exposed the  acetabulum, reamed the acetabulum up for a 52 mm cup.  We then inserted our  permanent metal 52 mm ASR cup.  We then inserted our trial prosthesis and  went through leg length measurements with the appropriate size stem.  We  selected a +5 C taper stem with a 46 mm ball for our permanent structures.  Following that, we then removed the trials and then we inserted our  permanent size 4 femoral component.  We had a good stable fit with the  component.  We then inserted our size +5 size 46 mm diameter metal ball and  we checked to make sure that no loose fragments in the acetabular cup.  There were none.  We then reduced the hip, took the hip through motion, had  excellent range of motion.  Following that, we irrigated the hip out, took  the hip through a range of motion  again, measured our leg lengths and the  leg lengths were equal.  I then reapproximated all the soft tissue  structures in the usual fashion.  The skin was closed metal staples and  sterile Neosporin dressing was applied.           ______________________________  Georges Lynch Darrelyn Hillock, M.D.    RAG/MEDQ  D:  10/04/2005  T:  10/04/2005  Job:  [id number]

## 2011-02-15 ENCOUNTER — Ambulatory Visit: Payer: Medicare Other | Admitting: Internal Medicine

## 2011-03-12 ENCOUNTER — Other Ambulatory Visit: Payer: Self-pay | Admitting: Internal Medicine

## 2011-06-12 ENCOUNTER — Other Ambulatory Visit: Payer: Self-pay | Admitting: *Deleted

## 2011-06-12 MED ORDER — AMLODIPINE BESYLATE 5 MG PO TABS: 5.0000 mg | ORAL_TABLET | Freq: Every day | ORAL | Status: DC

## 2011-06-27 ENCOUNTER — Ambulatory Visit (INDEPENDENT_AMBULATORY_CARE_PROVIDER_SITE_OTHER): Payer: Medicare Other | Admitting: Family

## 2011-06-27 ENCOUNTER — Other Ambulatory Visit (HOSPITAL_COMMUNITY)
Admission: RE | Admit: 2011-06-27 | Discharge: 2011-06-27 | Disposition: A | Discharge status: Home / Self Care | Payer: Medicare Other | Source: Ambulatory Visit | Attending: Family | Admitting: Family

## 2011-06-27 ENCOUNTER — Encounter: Payer: Self-pay | Admitting: Family

## 2011-06-27 ENCOUNTER — Other Ambulatory Visit: Payer: Self-pay | Admitting: Family

## 2011-06-27 VITALS — BP 116/78 | HR 75 | Temp 98.0°F | Resp 16 | Ht 61.0 in | Wt 142.1 lb

## 2011-06-27 DIAGNOSIS — Z124 Encounter for screening for malignant neoplasm of cervix: Secondary | ICD-10-CM | POA: Insufficient documentation

## 2011-06-27 DIAGNOSIS — Z01419 Encounter for gynecological examination (general) (routine) without abnormal findings: Secondary | ICD-10-CM

## 2011-06-27 DIAGNOSIS — Z13820 Encounter for screening for osteoporosis: Secondary | ICD-10-CM

## 2011-06-27 DIAGNOSIS — M949 Disorder of cartilage, unspecified: Secondary | ICD-10-CM

## 2011-06-27 DIAGNOSIS — M8580 Other specified disorders of bone density and structure, unspecified site: Secondary | ICD-10-CM

## 2011-06-27 DIAGNOSIS — Z1239 Encounter for other screening for malignant neoplasm of breast: Secondary | ICD-10-CM

## 2011-06-27 DIAGNOSIS — E785 Hyperlipidemia, unspecified: Secondary | ICD-10-CM

## 2011-06-27 DIAGNOSIS — F17200 Nicotine dependence, unspecified, uncomplicated: Secondary | ICD-10-CM

## 2011-06-27 DIAGNOSIS — M899 Disorder of bone, unspecified: Secondary | ICD-10-CM

## 2011-06-27 DIAGNOSIS — Z1231 Encounter for screening mammogram for malignant neoplasm of breast: Secondary | ICD-10-CM

## 2011-06-27 DIAGNOSIS — I10 Essential (primary) hypertension: Secondary | ICD-10-CM

## 2011-06-27 DIAGNOSIS — E119 Type 2 diabetes mellitus without complications: Secondary | ICD-10-CM

## 2011-06-27 DIAGNOSIS — E039 Hypothyroidism, unspecified: Secondary | ICD-10-CM

## 2011-06-27 DIAGNOSIS — Z0000 Encounter for general adult medical examination without abnormal findings: Secondary | ICD-10-CM

## 2011-06-27 DIAGNOSIS — R7309 Other abnormal glucose: Secondary | ICD-10-CM

## 2011-06-27 LAB — HEMOGLOBIN A1C: Mean Plasma Glucose: 117 mg/dL — ABNORMAL HIGH (ref <117)

## 2011-06-27 MED ORDER — AMLODIPINE BESYLATE 5 MG PO TABS: 5.0000 mg | ORAL_TABLET | Freq: Every day | ORAL | Status: DC

## 2011-06-27 NOTE — Assessment & Plan Note (Signed)
Obtain A1C, obtain urine microalbumin.

## 2011-06-27 NOTE — Progress Notes (Signed)
Subjective:    Patient ID: Jill Armstrong, female    DOB: June 16, 1945, 66 y.o.   MRN: [id number]  HPI  Subjective:   Patient here for Medicare annual wellness visit and management of other chronic and acute problems.  Hypothyroid- this has been rx'd by Dr. Artist Pais.  Feels well on current dose of levothyroxine.   Hyperlipidimia- not on meds- watches diet.  Tobacco abuse- 1 to 1.5 PPD.  She has never quit smoking.   DM2- she watches her diet, she is not on medications.    HTN- She is treated with amlodipine.  Risk factors:  At risk for CAD due to DM, HTN, tobacco abuse Roster of Physicians Providing Medical Care to Patient: None  Activities of Daily Living  In your present state of health, do you have any difficulty performing the following activities? Preparing food and eating?: No  Bathing yourself: No  Getting dressed: No  Using the toilet:No  Moving around from place to place: No  In the past year have you fallen or had a near fall?:No    Home Safety: Has smoke detector and wears seat belts. No firearms. No excess sun exposure.  Diet and Exercise  Current exercise habits: none Dietary issues discussed: healthy diet   Depression Screen  (Note: if answer to either of the following is "Yes", then a more complete depression screening is indicated)  Q1: Over the past two weeks, have you felt down, depressed or hopeless?no  Q2: Over the past two weeks, have you felt little interest or pleasure in doing things? no   The following portions of the patient's history were reviewed and updated as appropriate: allergies, current medications, past family history, past medical history, past social history, past surgical history and problem list.    Objective:   Vision:see nursing Hearing: Able to hear forced whisper at 6 feet. Body mass index: see nursing Cognitive Impairment Assessment: cognition, memory and judgment appear normal.   Assessment:   Medicare wellness utd on  preventive parameters   Last pap smear >2 yrs ago. Has never had an abnormal pap. Last bone density was ?2005.  Never had a colo.  She lives in [place].  Declines to schedule colo at this time.  Will call us when she is ready to schedule. Due for mammo- also, will plan to obtain bmet, vit d, hepatic function, lipids, tsh, urine microalbumin.  Plan:    During the course of the visit the patient was educated and counseled about appropriate screening and preventive services including:       Fall prevention   Screening mammography  Bone densitometry screening  Diabetes screening  Nutrition counseling   Vaccines / LABS Zostavax - she will check cost with insurance.  Patient Instructions (the written plan) was given to the patient.      Review of Systems  Constitutional: Negative for unexpected weight change.  HENT: Negative for congestion.   Eyes: Negative for visual disturbance.  Respiratory: Negative for cough and shortness of breath.   Cardiovascular: Negative for leg swelling.  Gastrointestinal: Negative for vomiting, diarrhea, constipation and blood in stool.  Genitourinary: Negative for dysuria, frequency and hematuria.  Musculoskeletal: Negative for arthralgias.  Skin: Negative for pallor.       Reports moles on back- unchanged.  Neurological: Negative for headaches.  Hematological: Negative for adenopathy.  Psychiatric/Behavioral:       Denies depression/anxiety       Objective:   Physical Exam  Constitutional: She is oriented  to person, place, and time. She appears well-developed and well-nourished. No distress.  HENT:  Head: Normocephalic and atraumatic.  Eyes: Conjunctivae are normal. Pupils are equal, round, and reactive to light. No scleral icterus.  Neck: Normal range of motion. Neck supple. No JVD present. No tracheal deviation present. No thyromegaly present.  Cardiovascular: Normal rate and regular rhythm.   No murmur heard. Pulmonary/Chest: Effort normal  and breath sounds normal. No respiratory distress. She has no wheezes. She has no rales. She exhibits no tenderness.  Abdominal: Soft. Bowel sounds are normal. She exhibits no distension and no mass. There is no tenderness. There is no rebound and no guarding.  Genitourinary:       Breasts: Examined lying  Right: Without masses, retractions, discharge or axillary adenopathy.  Left: Without masses, retractions, discharge or axillary adenopathy.  Inguinal/mons: Normal without inguinal adenopathy  External genitalia: Normal  BUS/Urethra/Skene's glands: Normal  Bladder: Normal  Vagina: Normal  Cervix: Normal  Uterus: normal in size, shape and contour. Midline and mobile  Adnexa/parametria:  Rt: Without masses or tenderness.  Lt: Without masses or tenderness.  Anus and perineum: Normal Pap was performed.   Musculoskeletal: She exhibits no edema.  Lymphadenopathy:    She has no cervical adenopathy.  Neurological: She is alert and oriented to person, place, and time. She displays normal reflexes. No cranial nerve deficit. Coordination normal.  Skin: Skin is warm and dry.       Several brown, flat benign appearing moles noted on back.   Psychiatric: She has a normal mood and affect. Her behavior is normal. Judgment and thought content normal.          Assessment & Plan:

## 2011-06-27 NOTE — Assessment & Plan Note (Signed)
Obtain dexa. Continue caltrate, obtain vitamin D level.

## 2011-06-27 NOTE — Assessment & Plan Note (Signed)
Pt counseled on the importance of quitting smoking. She tells me she is not ready to quit.

## 2011-06-27 NOTE — Assessment & Plan Note (Signed)
BP Readings from Last 3 Encounters:  06/27/11 116/78  08/17/10 114/70  01/20/10 120/80  BP looks good, not currently on meds.

## 2011-06-27 NOTE — Assessment & Plan Note (Signed)
Obtain TSH, continue synthroid.  

## 2011-06-27 NOTE — Assessment & Plan Note (Signed)
Pt counseled on low fat/low cholesterol diet. Obtain FLP.

## 2011-06-27 NOTE — Patient Instructions (Signed)
Please complete your blood work prior to leaving. Scheduled bone density at front desk. Schedule mammogram on first floor.  Follow up in 3 months.

## 2011-06-28 LAB — LIPID PANEL
HDL: 84 mg/dL (ref >39)
Total CHOL/HDL Ratio: 2.6 Ratio
Triglycerides: 134 mg/dL (ref <150)
VLDL: 27 mg/dL (ref 0–40)

## 2011-06-28 LAB — VITAMIN D 25 HYDROXY (VIT D DEFICIENCY, FRACTURES): Vit D, 25-Hydroxy: 47 ng/mL (ref 30–89)

## 2011-06-28 LAB — HEPATIC FUNCTION PANEL
Albumin: 4.9 g/dL (ref 3.5–5.2)
Bilirubin, Direct: 0.2 mg/dL (ref 0.0–0.3)
Total Bilirubin: 1 mg/dL (ref 0.3–1.2)

## 2011-06-28 LAB — BASIC METABOLIC PANEL WITH GFR
BUN: 16 mg/dL (ref 6–23)
CO2: 26 mEq/L (ref 19–32)
Calcium: 10 mg/dL (ref 8.4–10.5)
Chloride: 103 mEq/L (ref 96–112)
Creat: 0.81 mg/dL (ref 0.50–1.10)
Glucose, Bld: 124 mg/dL — ABNORMAL HIGH (ref 70–99)

## 2011-06-28 LAB — MICROALBUMIN / CREATININE URINE RATIO
Creatinine, Urine: 93 mg/dL
Microalb, Ur: 1.26 mg/dL (ref 0.00–1.89)

## 2011-06-29 ENCOUNTER — Telehealth: Payer: Self-pay | Admitting: Family

## 2011-06-29 MED ORDER — LEVOTHYROXINE SODIUM 100 MCG PO TABS: 100.0000 ug | ORAL_TABLET | Freq: Every day | ORAL | Status: DC

## 2011-06-29 NOTE — Telephone Encounter (Signed)
Pt.notified

## 2011-06-29 NOTE — Telephone Encounter (Signed)
Pls call pt and let her know that her sugar looks good. Cholesterol is mildly  Elevated. She should continue to work on a low fat/low chol diet and exercise. Thyroid function looks good. Continue current dose of thyroid medications- refill sent to pharmacy.

## 2011-06-30 ENCOUNTER — Encounter: Payer: Self-pay | Admitting: Family

## 2011-07-10 ENCOUNTER — Inpatient Hospital Stay: Admission: RE | Admit: 2011-07-10 | Payer: Medicare Other | Source: Ambulatory Visit

## 2011-07-24 ENCOUNTER — Ambulatory Visit (HOSPITAL_BASED_OUTPATIENT_CLINIC_OR_DEPARTMENT_OTHER)
Admission: RE | Admit: 2011-07-24 | Discharge: 2011-07-24 | Disposition: A | Discharge status: Home / Self Care | Payer: Medicare Other | Source: Ambulatory Visit | Attending: Family | Admitting: Family

## 2011-07-24 ENCOUNTER — Inpatient Hospital Stay (HOSPITAL_BASED_OUTPATIENT_CLINIC_OR_DEPARTMENT_OTHER): Admission: RE | Admit: 2011-07-24 | Payer: Medicare Other | Source: Ambulatory Visit

## 2011-07-24 ENCOUNTER — Encounter: Payer: Self-pay | Admitting: Family

## 2011-07-24 ENCOUNTER — Telehealth: Payer: Self-pay | Admitting: Family

## 2011-07-24 DIAGNOSIS — Z1231 Encounter for screening mammogram for malignant neoplasm of breast: Secondary | ICD-10-CM | POA: Insufficient documentation

## 2011-07-24 LAB — HM DEXA SCAN

## 2011-07-24 NOTE — Telephone Encounter (Signed)
Pls call pt and let her know that I have reviewed her bone density test.  It shows osteopenia.  She should continue calcium.  Make sure that her calcium supplement has vitamin D in it such as caltrate 600 + D bid.  She should get regular weight bearing exercise such as walking and plan to repeat bone density test in 2 yrs.

## 2011-07-25 NOTE — Telephone Encounter (Signed)
Pt.notified

## 2011-07-28 ENCOUNTER — Encounter: Payer: Self-pay | Admitting: Family

## 2011-07-31 ENCOUNTER — Encounter: Payer: Self-pay | Admitting: Family

## 2011-07-31 DIAGNOSIS — M8580 Other specified disorders of bone density and structure, unspecified site: Secondary | ICD-10-CM | POA: Insufficient documentation

## 2011-08-10 ENCOUNTER — Telehealth: Payer: Self-pay | Admitting: *Deleted

## 2011-08-10 NOTE — Telephone Encounter (Signed)
Received call from pt wanting to know if it is ok to take her calcium along with her norvasc and low dose asa. Advised her ok to take together. Pt wants to know is she can stop her multivitamin since it has calcium and vit d in it or should she continue taking it?

## 2011-08-10 NOTE — Telephone Encounter (Signed)
Ok to continue multivitamin.

## 2011-08-11 NOTE — Telephone Encounter (Signed)
Pt.notified

## 2011-09-27 ENCOUNTER — Ambulatory Visit: Payer: Medicare Other | Admitting: Family

## 2011-10-16 ENCOUNTER — Other Ambulatory Visit: Payer: Self-pay | Admitting: *Deleted

## 2011-10-16 NOTE — Telephone Encounter (Signed)
Received message from pt requesting refills on levothyroxine and amlodipine. Per our record, there should still be 1 refill remaining on each of these medications from March rx. Advised pt to contact her pharmacy. Also reminded pt that she is due for a follow up; pt declines to schedule at this time and will call when she gets back from vacation to arrange f/u.

## 2011-11-07 ENCOUNTER — Ambulatory Visit (INDEPENDENT_AMBULATORY_CARE_PROVIDER_SITE_OTHER): Payer: Medicare Other | Admitting: Family

## 2011-11-07 ENCOUNTER — Encounter: Payer: Self-pay | Admitting: Family

## 2011-11-07 VITALS — BP 110/68 | HR 88 | Temp 98.0°F | Resp 16 | Ht 61.0 in | Wt 137.0 lb

## 2011-11-07 DIAGNOSIS — R7309 Other abnormal glucose: Secondary | ICD-10-CM

## 2011-11-07 DIAGNOSIS — R7303 Prediabetes: Secondary | ICD-10-CM

## 2011-11-07 DIAGNOSIS — I10 Essential (primary) hypertension: Secondary | ICD-10-CM

## 2011-11-07 DIAGNOSIS — E039 Hypothyroidism, unspecified: Secondary | ICD-10-CM

## 2011-11-07 LAB — HEMOGLOBIN A1C: Mean Plasma Glucose: 114 mg/dL (ref <117)

## 2011-11-07 NOTE — Patient Instructions (Addendum)
Please complete your blood work prior to leaving.  Follow up in in 4 months.

## 2011-11-07 NOTE — Assessment & Plan Note (Signed)
Obtain A1C. 

## 2011-11-07 NOTE — Assessment & Plan Note (Signed)
TSH normal last visit.  Continue current dose synthroid.

## 2011-11-07 NOTE — Assessment & Plan Note (Signed)
BP Readings from Last 3 Encounters:  11/07/11 110/68  06/27/11 116/78  08/17/10 114/70  BP is stable on amlodipine.  Continue same.

## 2011-11-07 NOTE — Progress Notes (Signed)
  Subjective:    Patient ID: Jill Armstrong, female    DOB: 07-Jan-1946, 66 y.o.   MRN: [id number]  HPI  Ms.  Armstrong is a 66 yr old female who presents today for follow up.   DM2- denies unusual thirst. She does not check sugars at home.    HTN-  On amlodipine. Denies swelling. She tries to watch salt and cholesterol intake.    Osteopenia- enjoys walking.   Review of Systems See HPI  Past Medical History  Diagnosis Date  . Arthritis     osteoarthritis right hip  . Osteopenia   . Tobacco abuse   . Thyroid disease     hypothyroidism    History   Social History  . Marital Status: Divorced    Spouse Name: N/A    Number of Children: 4  . Years of Education: N/A   Occupational History  . Not on file.   Social History Main Topics  . Smoking status: Current Everyday Smoker -- 1.0 packs/day for 45 years    Types: Cigarettes  . Smokeless tobacco: Never Used  . Alcohol Use: Yes     socially  . Drug Use: Not on file  . Sexually Active: Not on file   Other Topics Concern  . Not on file   Social History Narrative   Caffeine Use:  2-6 cups dailyRegular exercise:  No    Past Surgical History  Procedure Date  . Tubal ligation   . Total hip arthroplasty 2007    right hip  . Tonsillectomy and adenoidectomy     Family History  Problem Relation Age of Onset  . Adopted: Yes    No Known Allergies  Current Outpatient Prescriptions on File Prior to Visit  Medication Sig Dispense Refill  . amLODipine (NORVASC) 5 MG tablet Take 1 tablet (5 mg total) by mouth daily.  90 tablet  1  . aspirin 81 MG tablet Take 81 mg by mouth daily.        . Calcium Carbonate-Vitamin D (CALTRATE 600+D) 600-400 MG-UNIT per tablet Take 1 tablet by mouth 2 (two) times daily.      Marland Kitchen levothyroxine (SYNTHROID, LEVOTHROID) 100 MCG tablet Take 1 tablet (100 mcg total) by mouth daily.  90 tablet  1  . Multiple Vitamins-Minerals (MULTIVITAMIN WITH MINERALS) tablet Take 1 tablet by mouth daily.            BP 110/68  Pulse 88  Temp 98 F (36.7 C) (Oral)  Resp 16  Ht 5\' 1"  (1.549 m)  Wt 137 lb (62.143 kg)  BMI 25.89 kg/m2  SpO2 97%       Objective:   Physical Exam  Constitutional: She appears well-developed and well-nourished. No distress.  Cardiovascular: Normal rate and regular rhythm.   No murmur heard. Pulmonary/Chest: Effort normal and breath sounds normal. No respiratory distress. She has no wheezes. She has no rales. She exhibits no tenderness.  Musculoskeletal: She exhibits no edema.  Psychiatric: She has a normal mood and affect. Her behavior is normal. Judgment and thought content normal.          Assessment & Plan:

## 2011-11-08 ENCOUNTER — Encounter: Payer: Self-pay | Admitting: Family

## 2011-11-15 ENCOUNTER — Ambulatory Visit (INDEPENDENT_AMBULATORY_CARE_PROVIDER_SITE_OTHER)
Admission: RE | Admit: 2011-11-15 | Discharge: 2011-11-15 | Disposition: A | Discharge status: Home / Self Care | Payer: Medicare Other | Source: Ambulatory Visit

## 2011-11-15 ENCOUNTER — Telehealth: Payer: Self-pay | Admitting: *Deleted

## 2011-11-15 DIAGNOSIS — Z13820 Encounter for screening for osteoporosis: Secondary | ICD-10-CM

## 2011-11-15 NOTE — Telephone Encounter (Signed)
Received call from radiology that pt has DEXA this morning and the order is not in EPIC. Order is viewable under chart review, imaging tab but not under appt desk orders tab. Order re-entered.

## 2011-11-21 ENCOUNTER — Telehealth: Payer: Self-pay | Admitting: Family

## 2011-11-21 NOTE — Telephone Encounter (Signed)
Please let pt know that her dexa show osteopenia (mild bone thinning).  She should have repeat dexa in 2 years, continue caltrate, and get regular weight bearing exercise.

## 2012-02-12 ENCOUNTER — Other Ambulatory Visit: Payer: Self-pay | Admitting: Family

## 2012-02-22 ENCOUNTER — Telehealth: Payer: Self-pay | Admitting: *Deleted

## 2012-02-22 NOTE — Telephone Encounter (Signed)
Pt called back stating she had already received the information that was needed.

## 2012-02-22 NOTE — Telephone Encounter (Signed)
Received message from pt that she is applying for additional insurance coverage and needs supervising physician for [facility] as well as ID #s. Left message on home# for pt to return my call. We will not be able to provide NPI or license #s.

## 2012-02-27 ENCOUNTER — Ambulatory Visit (INDEPENDENT_AMBULATORY_CARE_PROVIDER_SITE_OTHER): Payer: Medicare Other | Admitting: Family

## 2012-02-27 ENCOUNTER — Encounter: Payer: Self-pay | Admitting: Family

## 2012-02-27 VITALS — BP 110/70 | HR 84 | Temp 97.8°F | Resp 16 | Ht 61.0 in | Wt 139.0 lb

## 2012-02-27 DIAGNOSIS — R7309 Other abnormal glucose: Secondary | ICD-10-CM

## 2012-02-27 DIAGNOSIS — I10 Essential (primary) hypertension: Secondary | ICD-10-CM

## 2012-02-27 DIAGNOSIS — Z23 Encounter for immunization: Secondary | ICD-10-CM

## 2012-02-27 DIAGNOSIS — E039 Hypothyroidism, unspecified: Secondary | ICD-10-CM

## 2012-02-27 LAB — BASIC METABOLIC PANEL
CO2: 22 mEq/L (ref 19–32)
Chloride: 101 mEq/L (ref 96–112)
Potassium: 4.6 mEq/L (ref 3.5–5.3)

## 2012-02-27 LAB — TSH: TSH: 2.936 u[IU]/mL (ref 0.350–4.500)

## 2012-02-27 MED ORDER — AMLODIPINE BESYLATE 5 MG PO TABS: 5.0000 mg | ORAL_TABLET | Freq: Every day | ORAL | Status: DC

## 2012-02-27 NOTE — Patient Instructions (Addendum)
Please complete your blood work prior to leaving.  Follow up in 6 months.  

## 2012-02-27 NOTE — Assessment & Plan Note (Signed)
BP stable.  Continue amlodipine. 

## 2012-02-27 NOTE — Progress Notes (Signed)
  Subjective:    Patient ID: Jill Armstrong, female    DOB: 23-May-1945, 66 y.o.   MRN: [id number]  HPI  Jill Armstrong is a 66 yr old female who presents today for follow up.   1) HTN- she continues amlodipine.  Tolerating without difficulty.  Denies LE edema.    2) DM2 borderline-  Last a1c was 5.6.  3) Hypothyroid- continues levothyroxine.     Review of Systems See HPI  Past Medical History  Diagnosis Date  . Arthritis     osteoarthritis right hip  . Osteopenia   . Tobacco abuse   . Thyroid disease     hypothyroidism    History   Social History  . Marital Status: Divorced    Spouse Name: N/A    Number of Children: 4  . Years of Education: N/A   Occupational History  . Not on file.   Social History Main Topics  . Smoking status: Current Every Day Smoker -- 1.0 packs/day for 45 years    Types: Cigarettes  . Smokeless tobacco: Never Used  . Alcohol Use: Yes     Comment: socially  . Drug Use: Not on file  . Sexually Active: Not on file   Other Topics Concern  . Not on file   Social History Narrative   Caffeine Use:  2-6 cups dailyRegular exercise:  No    Past Surgical History  Procedure Date  . Tubal ligation   . Total hip arthroplasty 2007    right hip  . Tonsillectomy and adenoidectomy     Family History  Problem Relation Age of Onset  . Adopted: Yes    No Known Allergies  Current Outpatient Prescriptions on File Prior to Visit  Medication Sig Dispense Refill  . amLODipine (NORVASC) 5 MG tablet Take 1 tablet (5 mg total) by mouth daily.  90 tablet  1  . aspirin 81 MG tablet Take 81 mg by mouth daily.        . Calcium Carbonate-Vitamin D (CALTRATE 600+D) 600-400 MG-UNIT per tablet Take 1 tablet by mouth 2 (two) times daily.      Marland Kitchen levothyroxine (SYNTHROID, LEVOTHROID) 100 MCG tablet TAKE ONE TABLET BY MOUTH EVERY DAY  90 tablet  0  . Multiple Vitamins-Minerals (MULTIVITAMIN WITH MINERALS) tablet Take 1 tablet by mouth daily.          BP 110/70   Pulse 84  Temp 97.8 F (36.6 C) (Oral)  Resp 16  Ht 5\' 1"  (1.549 m)  Wt 139 lb 0.6 oz (63.068 kg)  BMI 26.27 kg/m2  SpO2 98%       Objective:   Physical Exam  Constitutional: She is oriented to person, place, and time. She appears well-developed and well-nourished. No distress.  HENT:  Head: Normocephalic and atraumatic.  Cardiovascular: Normal rate and regular rhythm.   No murmur heard. Pulmonary/Chest: Effort normal and breath sounds normal. No respiratory distress. She has no wheezes. She has no rales. She exhibits no tenderness.  Musculoskeletal: She exhibits no edema.  Neurological: She is alert and oriented to person, place, and time.  Skin: Skin is warm and dry.  Psychiatric: She has a normal mood and affect. Her behavior is normal. Judgment and thought content normal.          Assessment & Plan:

## 2012-02-27 NOTE — Assessment & Plan Note (Signed)
Last A1C was 5.6.  We discussed healthy diet today.

## 2012-02-27 NOTE — Assessment & Plan Note (Signed)
Clinically stable. Obtain TSH.  

## 2012-02-28 ENCOUNTER — Encounter: Payer: Self-pay | Admitting: Family

## 2012-05-20 ENCOUNTER — Other Ambulatory Visit: Payer: Self-pay | Admitting: Family

## 2012-07-31 ENCOUNTER — Telehealth: Payer: Self-pay | Admitting: *Deleted

## 2012-07-31 MED ORDER — LEVOTHYROXINE SODIUM 100 MCG PO TABS: ORAL_TABLET | ORAL | Status: DC

## 2012-07-31 NOTE — Telephone Encounter (Signed)
E-scribe failed, rx called to pharmacy voicemail.

## 2012-07-31 NOTE — Telephone Encounter (Signed)
Received call from pt stating she will be out of town the end of May for her son's wedding. She will be due for a refill on her levothyroxine. Pt has f/u on 09/10/12 and previously received a 90 day supply. Advised pt we would send 30 day supply x 1 refill to get her through until follow up labs are checked. Pt voices understanding, refill sent.

## 2012-08-27 ENCOUNTER — Ambulatory Visit: Payer: Medicare Other | Admitting: Family

## 2012-09-10 ENCOUNTER — Encounter: Payer: Self-pay | Admitting: Family

## 2012-09-10 ENCOUNTER — Ambulatory Visit (INDEPENDENT_AMBULATORY_CARE_PROVIDER_SITE_OTHER): Payer: Medicare Other | Admitting: Family

## 2012-09-10 ENCOUNTER — Telehealth: Payer: Self-pay | Admitting: *Deleted

## 2012-09-10 VITALS — BP 110/80 | HR 69 | Temp 97.7°F | Resp 16 | Ht 61.0 in | Wt 129.1 lb

## 2012-09-10 DIAGNOSIS — E039 Hypothyroidism, unspecified: Secondary | ICD-10-CM

## 2012-09-10 DIAGNOSIS — F17200 Nicotine dependence, unspecified, uncomplicated: Secondary | ICD-10-CM

## 2012-09-10 DIAGNOSIS — R7309 Other abnormal glucose: Secondary | ICD-10-CM

## 2012-09-10 DIAGNOSIS — R739 Hyperglycemia, unspecified: Secondary | ICD-10-CM

## 2012-09-10 DIAGNOSIS — I10 Essential (primary) hypertension: Secondary | ICD-10-CM

## 2012-09-10 LAB — BASIC METABOLIC PANEL
BUN: 13 mg/dL (ref 6–23)
CO2: 22 mEq/L (ref 19–32)
Chloride: 105 mEq/L (ref 96–112)
Glucose, Bld: 123 mg/dL — ABNORMAL HIGH (ref 70–99)
Potassium: 5.5 mEq/L — ABNORMAL HIGH (ref 3.5–5.3)

## 2012-09-10 MED ORDER — AMLODIPINE BESYLATE 5 MG PO TABS: 5.0000 mg | ORAL_TABLET | Freq: Every day | ORAL | Status: DC

## 2012-09-10 NOTE — Telephone Encounter (Signed)
Rx transmission to [facility] failed. Rx called to pharmacy voicemail and pt notified.  Patient Name Sex DOB SSN Address Phone   Jill Armstrong, Jill Armstrong Female 10-16-1945 [id number] [address] [name] [address] [phone] [facility]) [phone] (Work) [phone] (Mobile)         Medication Detail      Disp Refills Start End     amLODipine (NORVASC) 5 MG tablet 90 tablet 1 09/10/2012     Sig - Route: Take 1 tablet (5 mg total) by mouth daily. - Oral    E-Prescribing Status: Transmission to pharmacy failed (09/10/2012 10:01 AM EDT)

## 2012-09-10 NOTE — Assessment & Plan Note (Signed)
Stable on amlodipine, continue same.  

## 2012-09-10 NOTE — Progress Notes (Signed)
Subjective:    Patient ID: Jill Armstrong, female    DOB: 1945-12-29, 67 y.o.   MRN: [id number]  HPI  Jill Armstrong is a 67 yr old female who presents today for follow up of multiple medical problems:  1) Hypertension- She is maintained on amlodipine. Denies CP/SOB/swelling.   2) Hypothyroid-She is maintained on levothyroxine.  She has lost 10 pounds with exercise since he last visit.  3) Hyperglycemia-  Last A1C was 5.6 .  4) Tobacco abuse- pt reports that she has switched to E-cigs and plans to wean down each week.    Review of Systems See HPI  Past Medical History  Diagnosis Date  . Arthritis     osteoarthritis right hip  . Osteopenia   . Tobacco abuse   . Thyroid disease     hypothyroidism    History   Social History  . Marital Status: Divorced    Spouse Name: N/A    Number of Children: 4  . Years of Education: N/A   Occupational History  . Not on file.   Social History Main Topics  . Smoking status: Current Every Day Smoker -- 1.00 packs/day for 45 years    Types: Cigarettes  . Smokeless tobacco: Never Used     Comment: 3-4 cigarettes daily. Using e-cigs as well.  . Alcohol Use: Yes     Comment: socially  . Drug Use: Not on file  . Sexually Active: Not on file   Other Topics Concern  . Not on file   Social History Narrative   Caffeine Use:  2-6 cups daily   Regular exercise:  No          Past Surgical History  Procedure Laterality Date  . Tubal ligation    . Total hip arthroplasty  2007    right hip  . Tonsillectomy and adenoidectomy      Family History  Problem Relation Age of Onset  . Adopted: Yes    No Known Allergies  Current Outpatient Prescriptions on File Prior to Visit  Medication Sig Dispense Refill  . amLODipine (NORVASC) 5 MG tablet Take 1 tablet (5 mg total) by mouth daily.  90 tablet  1  . aspirin 81 MG tablet Take 81 mg by mouth daily.        . Calcium Carbonate-Vitamin D (CALTRATE 600+D) 600-400 MG-UNIT per tablet Take  1 tablet by mouth 2 (two) times daily.      Marland Kitchen levothyroxine (SYNTHROID, LEVOTHROID) 100 MCG tablet TAKE ONE TABLET BY MOUTH EVERY DAY  30 tablet  1  . Multiple Vitamins-Minerals (MULTIVITAMIN WITH MINERALS) tablet Take 1 tablet by mouth daily.        . cephALEXin (KEFLEX) 500 MG capsule Take 1 capsule 1 hour prior to procedure, take 1 capsule 6 hours after the first dose and take the last capsule 6 hours after the 2nd dose.       No current facility-administered medications on file prior to visit.    BP 110/80  Pulse 69  Temp(Src) 97.7 F (36.5 C) (Oral)  Resp 16  Ht 5\' 1"  (1.549 m)  Wt 129 lb 1.3 oz (58.55 kg)  BMI 24.4 kg/m2  SpO2 97%       Objective:   Physical Exam  Constitutional: She is oriented to person, place, and time. She appears well-developed and well-nourished. No distress.  Cardiovascular: Normal rate and regular rhythm.   No murmur heard. Pulmonary/Chest: Effort normal and breath sounds normal. No respiratory distress.  She has no wheezes. She has no rales. She exhibits no tenderness.  Musculoskeletal: She exhibits no edema.  Neurological: She is alert and oriented to person, place, and time.  Psychiatric: She has a normal mood and affect. Her behavior is normal. Judgment and thought content normal.          Assessment & Plan:

## 2012-09-10 NOTE — Assessment & Plan Note (Signed)
Obtain A1C, urine microalbumin. Clinically stable.

## 2012-09-10 NOTE — Assessment & Plan Note (Signed)
Clinically stable on synthroid, continue same. Obtain TSH.  

## 2012-09-10 NOTE — Patient Instructions (Addendum)
Please complete your lab work prior to leaving. Please schedule a follow up appointment in 6 months.   

## 2012-09-10 NOTE — Assessment & Plan Note (Signed)
Urged continued cessation of E cigarettes.

## 2012-09-11 ENCOUNTER — Telehealth: Payer: Self-pay | Admitting: Family

## 2012-09-11 DIAGNOSIS — E875 Hyperkalemia: Secondary | ICD-10-CM

## 2012-09-11 DIAGNOSIS — E039 Hypothyroidism, unspecified: Secondary | ICD-10-CM

## 2012-09-11 LAB — MICROALBUMIN / CREATININE URINE RATIO: Microalb, Ur: 1.77 mg/dL (ref 0.00–1.89)

## 2012-09-11 MED ORDER — LEVOTHYROXINE SODIUM 88 MCG PO TABS: 88.0000 ug | ORAL_TABLET | Freq: Every day | ORAL | Status: DC

## 2012-09-11 NOTE — Telephone Encounter (Signed)
Pls call pt and let her know potassium came back high- could be due to hemolysis of blood sample.  I would like her to repeat BMET within next 1 week- dx hyperkalemia.  Also based on thyroid test, I would like her to decrease synthroid dose from to 88 mcg and repeat TSH in 6 weeks (dx hypothyroid). rx sent.  Sugar appears well controlled.

## 2012-09-12 NOTE — Telephone Encounter (Signed)
Notified pt and she voices understanding. Future lab orders entered.

## 2012-09-16 ENCOUNTER — Other Ambulatory Visit: Payer: Self-pay | Admitting: Family

## 2012-09-16 DIAGNOSIS — Z1231 Encounter for screening mammogram for malignant neoplasm of breast: Secondary | ICD-10-CM

## 2012-09-19 ENCOUNTER — Ambulatory Visit (HOSPITAL_BASED_OUTPATIENT_CLINIC_OR_DEPARTMENT_OTHER)
Admission: RE | Admit: 2012-09-19 | Discharge: 2012-09-19 | Disposition: A | Discharge status: Home / Self Care | Payer: Medicare Other | Source: Ambulatory Visit | Attending: Family | Admitting: Family

## 2012-09-19 ENCOUNTER — Telehealth: Payer: Self-pay | Admitting: Family

## 2012-09-19 DIAGNOSIS — Z1231 Encounter for screening mammogram for malignant neoplasm of breast: Secondary | ICD-10-CM

## 2012-09-19 LAB — BASIC METABOLIC PANEL
CO2: 23 mEq/L (ref 19–32)
Chloride: 103 mEq/L (ref 96–112)
Glucose, Bld: 101 mg/dL — ABNORMAL HIGH (ref 70–99)
Potassium: 5.1 mEq/L (ref 3.5–5.3)
Sodium: 140 mEq/L (ref 135–145)

## 2012-09-19 NOTE — Telephone Encounter (Signed)
Synthroid should be decreased.  I recommend that she continue the synthroid 5 days a week and break in half and take 1/2 tab on Tuesday and 1/2 tab on Thursday.

## 2012-09-20 NOTE — Telephone Encounter (Signed)
Patient notified and advised of medication dose.

## 2012-10-31 ENCOUNTER — Telehealth: Payer: Self-pay | Admitting: *Deleted

## 2012-10-31 DIAGNOSIS — E039 Hypothyroidism, unspecified: Secondary | ICD-10-CM

## 2012-10-31 LAB — TSH: TSH: 16.27 u[IU]/mL — ABNORMAL HIGH (ref 0.350–4.500)

## 2012-10-31 NOTE — Telephone Encounter (Signed)
Pt presented to the lab for repeat TSH. Order entered and given to the lab.

## 2012-11-01 ENCOUNTER — Telehealth: Payer: Self-pay | Admitting: Family

## 2012-11-01 DIAGNOSIS — E039 Hypothyroidism, unspecified: Secondary | ICD-10-CM

## 2012-11-01 MED ORDER — LEVOTHYROXINE SODIUM 88 MCG PO TABS: 88.0000 ug | ORAL_TABLET | Freq: Every day | ORAL | Status: DC

## 2012-11-01 MED ORDER — LEVOTHYROXINE SODIUM 88 MCG PO TABS: ORAL_TABLET | ORAL | Status: DC

## 2012-11-01 NOTE — Telephone Encounter (Signed)
Reviewed lab work.  TSH low. Spoke to pt. She understood instructions to be synthroid 88 mcg 3 days a week, 1/2 tab 2 days a week and none on weekends.  TSH is low. Advised pt to go up to 88 mcg once daily and to repeat TSH in 4-6 weeks.

## 2012-11-01 NOTE — Telephone Encounter (Signed)
Called pt back.  She wanted to double check dose- told her dose is 88 mcg once daily.  She verbalizes understanding.  (I called [facility] and notified pharmacist that the synthroid instructions should be one tablet PO daily as the rx I sent in went with wrong instructions. He changed Rx).    Please send TSH to lab dx hypothyroid, for 1st week of September.

## 2012-11-01 NOTE — Telephone Encounter (Signed)
She would like to speak with [name] about a call she got telling her she had been on the wrong med for 2 months and to start a different med

## 2012-11-04 NOTE — Telephone Encounter (Signed)
Lab order entered per instruction below.

## 2013-03-11 ENCOUNTER — Ambulatory Visit: Payer: Medicare Other | Admitting: Family

## 2013-03-20 ENCOUNTER — Other Ambulatory Visit: Payer: Self-pay | Admitting: Family

## 2013-04-22 ENCOUNTER — Telehealth: Payer: Self-pay | Admitting: *Deleted

## 2013-04-22 ENCOUNTER — Ambulatory Visit (INDEPENDENT_AMBULATORY_CARE_PROVIDER_SITE_OTHER): Payer: Medicare Other | Admitting: Family

## 2013-04-22 ENCOUNTER — Encounter: Payer: Self-pay | Admitting: Family

## 2013-04-22 VITALS — BP 126/72 | HR 85 | Temp 98.4°F | Resp 16 | Ht 61.0 in | Wt 134.0 lb

## 2013-04-22 DIAGNOSIS — E039 Hypothyroidism, unspecified: Secondary | ICD-10-CM

## 2013-04-22 DIAGNOSIS — E871 Hypo-osmolality and hyponatremia: Secondary | ICD-10-CM

## 2013-04-22 DIAGNOSIS — R739 Hyperglycemia, unspecified: Secondary | ICD-10-CM

## 2013-04-22 DIAGNOSIS — I10 Essential (primary) hypertension: Secondary | ICD-10-CM

## 2013-04-22 DIAGNOSIS — R7309 Other abnormal glucose: Secondary | ICD-10-CM

## 2013-04-22 DIAGNOSIS — Z23 Encounter for immunization: Secondary | ICD-10-CM

## 2013-04-22 LAB — BASIC METABOLIC PANEL
BUN: 10 mg/dL (ref 6–23)
CALCIUM: 10.4 mg/dL (ref 8.4–10.5)
CO2: 25 meq/L (ref 19–32)
CREATININE: 0.68 mg/dL (ref 0.50–1.10)
Chloride: 99 mEq/L (ref 96–112)
GLUCOSE: 125 mg/dL — AB (ref 70–99)
Potassium: 4.7 mEq/L (ref 3.5–5.3)
Sodium: 135 mEq/L (ref 135–145)

## 2013-04-22 LAB — HEMOGLOBIN A1C
Hgb A1c MFr Bld: 5.8 % — ABNORMAL HIGH (ref <5.7)
MEAN PLASMA GLUCOSE: 120 mg/dL — AB (ref <117)

## 2013-04-22 LAB — TSH: TSH: 6.104 u[IU]/mL — ABNORMAL HIGH (ref 0.350–4.500)

## 2013-04-22 NOTE — Assessment & Plan Note (Signed)
Stable on synthroid, continue same obtaint follow up TSH.

## 2013-04-22 NOTE — Telephone Encounter (Signed)
Received call from pt stating she saw hyponatremia on her paperwork today and pt doesn't remember being told she ever had low sodium level before and is wanting to confirm the diagnosis.  Please advise?

## 2013-04-22 NOTE — Progress Notes (Signed)
Subjective:    Patient ID: Jill Armstrong, female    DOB: 1945-08-18, 68 y.o.   MRN: [id number]  HPI  Jill Armstrong is a 68 yr old female who presents today for follow up of multiple medical problems.  1) HTN-  Denies CP/SOB or swelling. Continues amlodipine- tolerating without side effects.  BP Readings from Last 3 Encounters:  04/22/13 126/72  09/10/12 110/80  02/27/12 110/70    2) Hypothyroid- continues synthroid.   3) Hyperglycemia- last eye exam 1 year ago.  Lab Results  Component Value Date   HGBA1C 5.7* 09/10/2012     Review of Systems See HPI  Past Medical History  Diagnosis Date  . Arthritis     osteoarthritis right hip  . Osteopenia   . Tobacco abuse   . Thyroid disease     hypothyroidism    History   Social History  . Marital Status: Divorced    Spouse Name: N/A    Number of Children: 4  . Years of Education: N/A   Occupational History  . Not on file.   Social History Main Topics  . Smoking status: Current Every Day Smoker -- 1.00 packs/day for 45 years    Types: Cigarettes  . Smokeless tobacco: Never Used     Comment: 3-4 cigarettes daily. Using e-cigs as well.  . Alcohol Use: Yes     Comment: socially  . Drug Use: Not on file  . Sexual Activity: Not on file   Other Topics Concern  . Not on file   Social History Narrative   Caffeine Use:  2-6 cups daily   Regular exercise:  No          Past Surgical History  Procedure Laterality Date  . Tubal ligation    . Total hip arthroplasty  2007    right hip  . Tonsillectomy and adenoidectomy      Family History  Problem Relation Age of Onset  . Adopted: Yes    No Known Allergies  Current Outpatient Prescriptions on File Prior to Visit  Medication Sig Dispense Refill  . amLODipine (NORVASC) 5 MG tablet TAKE ONE TABLET BY MOUTH ONCE DAILY  90 tablet  0  . aspirin 81 MG tablet Take 81 mg by mouth daily.        . Calcium Carbonate-Vitamin D (CALTRATE 600+D) 600-400 MG-UNIT per tablet  Take 1 tablet by mouth 2 (two) times daily.      Marland Kitchen. levothyroxine (SYNTHROID, LEVOTHROID) 88 MCG tablet TAKE ONE TABLET BY MOUTH ONCE DAILY  30 tablet  0  . Multiple Vitamins-Minerals (MULTIVITAMIN WITH MINERALS) tablet Take 1 tablet by mouth daily.         No current facility-administered medications on file prior to visit.    BP 126/72  Pulse 85  Temp(Src) 98.4 F (36.9 C) (Oral)  Resp 16  Ht 5\' 1"  (1.549 m)  Wt 134 lb (60.782 kg)  BMI 25.33 kg/m2  SpO2 97%       Objective:   Physical Exam  Constitutional: She is oriented to person, place, and time. She appears well-developed and well-nourished. No distress.  HENT:  Head: Normocephalic and atraumatic.  Cardiovascular: Normal rate and regular rhythm.   No murmur heard. Pulmonary/Chest: Effort normal and breath sounds normal. No respiratory distress. She has no wheezes. She has no rales. She exhibits no tenderness.  Musculoskeletal: She exhibits no edema.  Neurological: She is alert and oriented to person, place, and time.  Psychiatric: She  has a normal mood and affect. Her behavior is normal. Judgment and thought content normal.          Assessment & Plan:

## 2013-04-22 NOTE — Assessment & Plan Note (Signed)
Obtain bmet.  Continue current dose of amlodipine.

## 2013-04-22 NOTE — Telephone Encounter (Signed)
Please call pt and let her know that her sodium testing on record is normal. Hyponatremia was entered in error.

## 2013-04-22 NOTE — Assessment & Plan Note (Signed)
Clinically stable.  Obtain A1C.  

## 2013-04-22 NOTE — Telephone Encounter (Signed)
Notified pt and she voices understanding. 

## 2013-04-22 NOTE — Progress Notes (Signed)
Pre visit review using our clinic review tool, if applicable. No additional management support is needed unless otherwise documented below in the visit note. 

## 2013-04-22 NOTE — Telephone Encounter (Signed)
Pt requests refill of levothyroxine. Has 4 pills left and is having labs checked today. Please advise when labs final so we can send Rx.

## 2013-04-22 NOTE — Patient Instructions (Signed)
Please complete your lab work prior to leaving.  Follow up in 6 months for a medicare wellness visit.

## 2013-04-23 MED ORDER — LEVOTHYROXINE SODIUM 100 MCG PO TABS: 100.0000 ug | ORAL_TABLET | Freq: Every day | ORAL | Status: DC

## 2013-04-23 NOTE — Telephone Encounter (Signed)
Notified pt. Future lab order entered. 

## 2013-04-23 NOTE — Telephone Encounter (Addendum)
Please let pt know TSH is improved but we still need to increase dose slightly.  Rx increased from to once daily. Repeat TSH in 6 weeks, dx hypothyroid. Sugar and electrolytes look good.

## 2013-05-10 ENCOUNTER — Telehealth: Payer: Self-pay | Admitting: Family

## 2013-05-10 NOTE — Telephone Encounter (Signed)
Relevant patient education mailed to patient.  

## 2013-05-13 ENCOUNTER — Telehealth: Payer: Self-pay | Admitting: Family

## 2013-05-13 NOTE — Telephone Encounter (Signed)
Relevant patient education mailed to patient.  

## 2013-06-20 ENCOUNTER — Telehealth: Payer: Self-pay | Admitting: Family

## 2013-06-20 NOTE — Telephone Encounter (Signed)
Pt coming for TSH on Tuesday or Wednesday, does she need to refill levothyroxine 100mcg?

## 2013-06-24 NOTE — Telephone Encounter (Signed)
Advised pt that we should have lab results within 24-72 hours. She states she has enough of her current strength to last until Saturday. Advised pt to call me by Friday if we have not contacted her with result at that time and she voices understanding. Lab order is already in system.

## 2013-06-25 LAB — TSH: TSH: 3.951 u[IU]/mL (ref 0.350–4.500)

## 2013-06-27 ENCOUNTER — Telehealth: Payer: Self-pay | Admitting: Family

## 2013-06-27 ENCOUNTER — Encounter: Payer: Self-pay | Admitting: Family

## 2013-06-27 DIAGNOSIS — E039 Hypothyroidism, unspecified: Secondary | ICD-10-CM

## 2013-06-27 MED ORDER — LEVOTHYROXINE SODIUM 112 MCG PO TABS: 112.0000 ug | ORAL_TABLET | Freq: Every day | ORAL | Status: DC

## 2013-06-27 NOTE — Telephone Encounter (Signed)
Notified pt and she voices understanding. Lab order entered. 

## 2013-06-27 NOTE — Telephone Encounter (Signed)
Please call pt and let her know that her thyroid testing is improving.  I would like to increase synthroid slightly from 100 mcg to 112 mcg. Repeat TSH in 6 weeks (dx is hypothryroid).

## 2013-07-30 LAB — TSH: TSH: 1.089 u[IU]/mL (ref 0.350–4.500)

## 2013-08-01 ENCOUNTER — Telehealth: Payer: Self-pay | Admitting: *Deleted

## 2013-08-01 MED ORDER — LEVOTHYROXINE SODIUM 112 MCG PO TABS: 112.0000 ug | ORAL_TABLET | Freq: Every day | ORAL | Status: DC

## 2013-08-01 NOTE — Telephone Encounter (Signed)
Pt left message that she had labs completed yesterday. She is wanting to know if she should continue her current dose of levothyroxine ( ) or dose it need to be changed?

## 2013-08-01 NOTE — Telephone Encounter (Signed)
Notified pt and she voices understanding. 

## 2013-08-01 NOTE — Telephone Encounter (Signed)
Continue current dose of synthroid. [facility]

## 2013-08-11 ENCOUNTER — Other Ambulatory Visit: Payer: Self-pay | Admitting: Family

## 2013-10-21 ENCOUNTER — Encounter: Payer: Self-pay | Admitting: Family

## 2013-10-21 ENCOUNTER — Ambulatory Visit (INDEPENDENT_AMBULATORY_CARE_PROVIDER_SITE_OTHER): Payer: Medicare Other | Admitting: Family

## 2013-10-21 VITALS — BP 122/80 | HR 64 | Temp 98.1°F | Resp 18 | Ht 60.0 in | Wt 132.0 lb

## 2013-10-21 DIAGNOSIS — Z0000 Encounter for general adult medical examination without abnormal findings: Secondary | ICD-10-CM

## 2013-10-21 DIAGNOSIS — Z13820 Encounter for screening for osteoporosis: Secondary | ICD-10-CM

## 2013-10-21 DIAGNOSIS — Z1239 Encounter for other screening for malignant neoplasm of breast: Secondary | ICD-10-CM

## 2013-10-21 DIAGNOSIS — R7309 Other abnormal glucose: Secondary | ICD-10-CM

## 2013-10-21 DIAGNOSIS — R739 Hyperglycemia, unspecified: Secondary | ICD-10-CM

## 2013-10-21 DIAGNOSIS — E785 Hyperlipidemia, unspecified: Secondary | ICD-10-CM

## 2013-10-21 LAB — LIPID PANEL
CHOL/HDL RATIO: 2.6 ratio
Cholesterol: 216 mg/dL — ABNORMAL HIGH (ref 0–200)
HDL: 84 mg/dL (ref >39)
LDL CALC: 105 mg/dL — AB (ref 0–99)
TRIGLYCERIDES: 133 mg/dL (ref <150)
VLDL: 27 mg/dL (ref 0–40)

## 2013-10-21 LAB — BASIC METABOLIC PANEL WITH GFR
BUN: 12 mg/dL (ref 6–23)
CO2: 26 meq/L (ref 19–32)
CREATININE: 0.7 mg/dL (ref 0.50–1.10)
Calcium: 10.3 mg/dL (ref 8.4–10.5)
Chloride: 103 mEq/L (ref 96–112)
GFR, Est African American: >89 mL/min
GFR, Est Non African American: 89 mL/min
Glucose, Bld: 120 mg/dL — ABNORMAL HIGH (ref 70–99)
Potassium: 4.9 mEq/L (ref 3.5–5.3)
Sodium: 138 mEq/L (ref 135–145)

## 2013-10-21 LAB — HEMOGLOBIN A1C
Hgb A1c MFr Bld: 5.7 % — ABNORMAL HIGH (ref <5.7)
Mean Plasma Glucose: 117 mg/dL — ABNORMAL HIGH (ref <117)

## 2013-10-21 NOTE — Progress Notes (Signed)
Pre visit review using our clinic review tool, if applicable. No additional management support is needed unless otherwise documented below in the visit note. 

## 2013-10-21 NOTE — Progress Notes (Signed)
Subjective:    Patient ID: Jill Armstrong, female    DOB: [birth year], 68 y.o.   MRN: [id number]  HPI  Subjective:   Patient here for Medicare annual wellness visit and management of other chronic and acute problems. Immunizations: Will be due for prevnar next year, due for zostavax Colonoscopy: due Dexa: due Pap Smear:would like to wait until next year Mammogram: due  Risk factors: At risk for CAD given multiple medical problems.   Roster of Physicians Providing Medical Care to Patient: No specialists  Activities of Daily Living  In your present state of health, do you have any difficulty performing the following activities? Preparing food and eating?: No  Bathing yourself: No  Getting dressed: No  Using the toilet:No  Moving around from place to place: No  In the past year have you fallen or had a near fall?:No    Home Safety: Has smoke detector and wears seat belts. No firearms. Some sun exposure- advised to use susnscreen  Diet and Exercise  Current exercise habits: no Dietary issues discussed: fair diet  Depression Screen  (Note: if answer to either of the following is "Yes", then a more complete depression screening is indicated)  Q1: Over the past two weeks, have you felt down, depressed or hopeless?no  Q2: Over the past two weeks, have you felt little interest or pleasure in doing things? no   The following portions of the patient's history were reviewed and updated as appropriate: allergies, current medications, past family history, past medical history, past social history, past surgical history and problem list.    Objective:   Vision:  See nursing Hearing: able to hear forced whisper at 6 feet Body mass index: Body mass index is 25.79 kg/(m^2). Cognitive Impairment Assessment: cognition, memory  Past Medical History  Diagnosis Date  . Arthritis     osteoarthritis right hip  . Osteopenia   . Tobacco abuse   . Thyroid disease     hypothyroidism     History   Social History  . Marital Status: Divorced    Spouse Name: N/A    Number of Children: 4  . Years of Education: N/A   Occupational History  . Not on file.   Social History Main Topics  . Smoking status: Current Every Day Smoker -- 1.00 packs/day for 45 years    Types: Cigarettes  . Smokeless tobacco: Never Used     Comment: 3-4 cigarettes daily. Using e-cigs as well.  . Alcohol Use: Yes     Comment: socially  . Drug Use: Not on file  . Sexual Activity: Not on file   Other Topics Concern  . Not on file   Social History Narrative   Caffeine Use:  2-6 cups daily   Regular exercise:  No          Past Surgical History  Procedure Laterality Date  . Tubal ligation    . Total hip arthroplasty  2007    right hip  . Tonsillectomy and adenoidectomy      Family History  Problem Relation Age of Onset  . Adopted: Yes    No Known Allergies  Current Outpatient Prescriptions on File Prior to Visit  Medication Sig Dispense Refill  . amLODipine (NORVASC) 5 MG tablet TAKE ONE TABLET BY MOUTH ONCE DAILY  90 tablet  0  . aspirin 81 MG tablet Take 81 mg by mouth daily.        . Calcium Carbonate-Vitamin D (CALTRATE 600+D) 600-400  MG-UNIT per tablet Take 1 tablet by mouth 2 (two) times daily.      Marland Kitchen. levothyroxine (SYNTHROID, LEVOTHROID) 112 MCG tablet Take 1 tablet (112 mcg total) by mouth daily.  30 tablet  2  . Multiple Vitamins-Minerals (MULTIVITAMIN WITH MINERALS) tablet Take 1 tablet by mouth daily.         No current facility-administered medications on file prior to visit.    BP 122/80  Pulse 64  Temp(Src) 98.1 F (36.7 C) (Oral)  Resp 18  Ht 5' (1.524 m)  Wt 132 lb 0.6 oz (59.893 kg)  BMI 25.79 kg/m2  SpO2 97%  Physical Exam  Constitutional: She is oriented to person, place, and time. She appears well-developed and well-nourished. No distress.  HENT:  Head: Normocephalic and atraumatic.  Right Ear: Tympanic membrane and ear canal normal.  Left  Ear: Tympanic membrane and ear canal normal.  Mouth/Throat: Oropharynx is clear and moist.  Eyes: Pupils are equal, round, and reactive to light. No scleral icterus.  Neck: Normal range of motion. No thyromegaly present.  Cardiovascular: Normal rate and regular rhythm.   No murmur heard. Pulmonary/Chest: Effort normal and breath sounds normal. No respiratory distress. He has no wheezes. She has no rales. She exhibits no tenderness.  Abdominal: Soft. Bowel sounds are normal. He exhibits no distension and no mass. There is no tenderness. There is no rebound and no guarding.  Musculoskeletal: She exhibits no edema.  Lymphadenopathy:    She has no cervical adenopathy.  Neurological: She is alert and oriented to person, place, and time. She has normal patellar reflexes. She exhibits normal muscle tone. Coordination normal.  Skin: Skin is warm and dry. benign appearing lesion left lateral chest and another benign appearing lesion left upper back- most consistent with seborrheic keratosis Psychiatric: She has a normal mood and affect. Her behavior is normal. Judgment and thought content normal.  Breasts: Examined lying Right: Without masses, retractions, discharge or axillary adenopathy.  Left: Without masses, retractions, discharge or axillary adenopathy.           Assessment & Plan:      Assessment:   Medicare wellness preventive parameters - due for zostavax, colo, dexa, mammogram  Plan:   During the course of the visit the patient was educated and counseled about appropriate screening and preventive services including:       Screening mammography  Bone densitometry screening   Agreeable to proceed with bone density and mammogram,  She wishes to discuss colo with daughter who would be her transportation and will let me know if she wishes to proceed.  Vaccines / LABS She will check with her insurance and schedule nurse visit for Zostavax. Patient Instructions (the written plan)  was given to the patient.        Review of Systems  HENT: Positive for rhinorrhea.   Respiratory: Negative for cough and shortness of breath.   Cardiovascular: Negative for chest pain.  Genitourinary: Negative for dysuria and frequency.  Musculoskeletal: Positive for arthralgias.  Skin: Negative for rash.       Reports 2 moles of concern       Objective:   Physical Exam        Assessment & Plan:

## 2013-10-21 NOTE — Patient Instructions (Signed)
Schedule mammogram on the first floor. Call me when you are ready to proceed with colonoscopy and shingles vaccine. You will be contacted re: scheduling bone density. Complete lab work prior to leaving.  Please schedule a follow up appointment in 6 months.

## 2013-10-23 ENCOUNTER — Encounter: Payer: Self-pay | Admitting: Family

## 2013-11-04 ENCOUNTER — Telehealth: Payer: Self-pay | Admitting: *Deleted

## 2013-11-04 MED ORDER — LEVOTHYROXINE SODIUM 112 MCG PO TABS: 112.0000 ug | ORAL_TABLET | Freq: Every day | ORAL | Status: DC

## 2013-11-04 NOTE — Telephone Encounter (Signed)
Received call from pt that her refills have expired on levothyroxine. Refills sent, pt notified.

## 2013-11-05 ENCOUNTER — Other Ambulatory Visit: Payer: Medicare Other

## 2013-11-18 ENCOUNTER — Other Ambulatory Visit: Payer: Self-pay | Admitting: Family

## 2013-11-18 ENCOUNTER — Other Ambulatory Visit: Payer: Self-pay

## 2013-11-18 MED ORDER — AMLODIPINE BESYLATE 5 MG PO TABS: ORAL_TABLET | ORAL | Status: DC

## 2014-04-22 ENCOUNTER — Ambulatory Visit: Payer: Medicare Other | Admitting: Family

## 2014-04-30 ENCOUNTER — Encounter: Payer: Self-pay | Admitting: Family

## 2014-04-30 ENCOUNTER — Ambulatory Visit (INDEPENDENT_AMBULATORY_CARE_PROVIDER_SITE_OTHER): Payer: Medicare Other | Admitting: Family

## 2014-04-30 VITALS — BP 104/70 | HR 72 | Temp 98.1°F | Resp 16 | Ht 60.0 in | Wt 133.6 lb

## 2014-04-30 DIAGNOSIS — E038 Other specified hypothyroidism: Secondary | ICD-10-CM

## 2014-04-30 DIAGNOSIS — R739 Hyperglycemia, unspecified: Secondary | ICD-10-CM

## 2014-04-30 DIAGNOSIS — I10 Essential (primary) hypertension: Secondary | ICD-10-CM

## 2014-04-30 DIAGNOSIS — E785 Hyperlipidemia, unspecified: Secondary | ICD-10-CM

## 2014-04-30 LAB — HEPATIC FUNCTION PANEL
ALT: 23 U/L (ref 0–35)
AST: 25 U/L (ref 0–37)
Albumin: 4.6 g/dL (ref 3.5–5.2)
Alkaline Phosphatase: 68 U/L (ref 39–117)
BILIRUBIN DIRECT: 0.2 mg/dL (ref 0.0–0.3)
TOTAL PROTEIN: 7.2 g/dL (ref 6.0–8.3)
Total Bilirubin: 1.2 mg/dL (ref 0.2–1.2)

## 2014-04-30 LAB — BASIC METABOLIC PANEL
BUN: 12 mg/dL (ref 6–23)
CHLORIDE: 105 meq/L (ref 96–112)
CO2: 23 meq/L (ref 19–32)
Calcium: 10.1 mg/dL (ref 8.4–10.5)
Creatinine, Ser: 0.77 mg/dL (ref 0.40–1.20)
GFR: 78.77 mL/min (ref >60.00)
GLUCOSE: 126 mg/dL — AB (ref 70–99)
Potassium: 3.9 mEq/L (ref 3.5–5.1)
SODIUM: 137 meq/L (ref 135–145)

## 2014-04-30 LAB — LIPID PANEL
CHOLESTEROL: 213 mg/dL — AB (ref 0–200)
HDL: 87.3 mg/dL (ref >39.00)
LDL CALC: 106 mg/dL — AB (ref 0–99)
NONHDL: 125.7
Total CHOL/HDL Ratio: 2
Triglycerides: 99 mg/dL (ref 0.0–149.0)
VLDL: 19.8 mg/dL (ref 0.0–40.0)

## 2014-04-30 LAB — HEMOGLOBIN A1C: Hgb A1c MFr Bld: 6 % (ref 4.6–6.5)

## 2014-04-30 MED ORDER — LEVOTHYROXINE SODIUM 112 MCG PO TABS: 112.0000 ug | ORAL_TABLET | Freq: Every day | ORAL | Status: DC

## 2014-04-30 NOTE — Assessment & Plan Note (Signed)
BP stable, continue current meds. Obtain bmet. 

## 2014-04-30 NOTE — Progress Notes (Signed)
Pre visit review using our clinic review tool, if applicable. No additional management support is needed unless otherwise documented below in the visit note. 

## 2014-04-30 NOTE — Assessment & Plan Note (Signed)
Obtain follow up a1c, clinically stable.

## 2014-04-30 NOTE — Patient Instructions (Signed)
Please complete lab work prior to leaving.  Follow up in 6 months.  

## 2014-04-30 NOTE — Assessment & Plan Note (Signed)
Obtain follow up lipid panel, continue low fat/low cholesterol diet.

## 2014-04-30 NOTE — Assessment & Plan Note (Signed)
Clinically stable on synthroid, obtain tsh. 

## 2014-04-30 NOTE — Progress Notes (Signed)
Subjective:    Patient ID: Jill Armstrong, female    DOB: 07-11-1945, [age] y.o.   MRN: [id number]  HPI Jill Armstrong is here today for follow up.  1. Hypothyroidism: She reports positive compliance with levothyroxine. She  denies fatigue, cold intolerance, constipation, weight changes. Lab Results  Component Value Date   TSH 1.089 07/30/2013    2. HYPERTENSION: Reports compliance with amlodipine. The patient denies the following associated symptoms: chest pain, dyspnea, blurred vision, headache, or lower extremity edema. BP Readings from Last 3 Encounters:  04/30/14 104/70  10/21/13 122/80  04/22/13 126/72    3. Hyperlipidemia: Diet: avoids fat and fried foods. Lab Results  Component Value Date   CHOL 216* 10/21/2013   HDL 84 10/21/2013   LDLCALC 105* 10/21/2013   TRIG 133 10/21/2013   CHOLHDL 2.6 10/21/2013    4. Hyperglycemia: Diet: Cooks at home. No sweets but does eat potatoes. Loves sandwiches and uses wheat bread Exercise: was exercising in summer and fall but not recently.  Lab Results  Component Value Date   HGBA1C 5.7* 10/21/2013    Review of Systems  Constitutional: Negative for fever and chills.  Respiratory: Negative for shortness of breath.   Cardiovascular: Negative for chest pain, palpitations and leg swelling.  Gastrointestinal: Negative for abdominal pain and constipation.  Psychiatric/Behavioral:       Denies anxiety/depression.   Past Medical History  Diagnosis Date  . Arthritis     osteoarthritis right hip  . Osteopenia   . Tobacco abuse   . Thyroid disease     hypothyroidism    History   Social History  . Marital Status: Divorced    Spouse Name: N/A    Number of Children: 4  . Years of Education: N/A   Occupational History  . Not on file.   Social History Main Topics  . Smoking status: Current Every Day Smoker -- 1.00 packs/day for 45 years    Types: Cigarettes  . Smokeless tobacco: Never Used     Comment: 3-4 cigarettes  daily. Using e-cigs as well.  . Alcohol Use: Yes     Comment: socially  . Drug Use: Not on file  . Sexual Activity: Not on file   Other Topics Concern  . Not on file   Social History Narrative   Caffeine Use:  2-6 cups daily   Regular exercise:  No          Past Surgical History  Procedure Laterality Date  . Tubal ligation    . Total hip arthroplasty  2007    right hip  . Tonsillectomy and adenoidectomy      Family History  Problem Relation Age of Onset  . Adopted: Yes  . Family history unknown: Yes    No Known Allergies  Current Outpatient Prescriptions on File Prior to Visit  Medication Sig Dispense Refill  . amLODipine (NORVASC) 5 MG tablet Take 5 mg by mouth daily.    Marland Kitchen. aspirin 81 MG tablet Take 81 mg by mouth daily.      . Calcium Carbonate-Vitamin D (CALTRATE 600+D) 600-400 MG-UNIT per tablet Take 1 tablet by mouth 2 (two) times daily.    . Multiple Vitamins-Minerals (MULTIVITAMIN WITH MINERALS) tablet Take 1 tablet by mouth daily.       No current facility-administered medications on file prior to visit.    BP 104/70 mmHg  Pulse 72  Temp(Src) 98.1 F (36.7 C) (Oral)  Resp 16  Ht 5' (1.524 m)  Wt 133 lb 9.6 oz (60.601 kg)  BMI 26.09 kg/m2  SpO2 96%       Objective:   Physical Exam  Constitutional: She appears well-developed and well-nourished.  HENT:  Head: Normocephalic and atraumatic.  Cardiovascular: Normal rate, regular rhythm and normal heart sounds.  Exam reveals no gallop and no friction rub.   No murmur heard. Pulmonary/Chest: Effort normal and breath sounds normal. No respiratory distress. She has no wheezes. She has no rales.  Musculoskeletal:  No lower extremity edema.  Skin: Skin is warm and dry.  Psychiatric: She has a normal mood and affect. Her behavior is normal. Judgment and thought content normal.          Assessment & Plan:

## 2014-05-02 ENCOUNTER — Encounter: Payer: Self-pay | Admitting: Family

## 2014-05-02 DIAGNOSIS — R7303 Prediabetes: Secondary | ICD-10-CM

## 2014-05-02 DIAGNOSIS — R739 Hyperglycemia, unspecified: Secondary | ICD-10-CM | POA: Insufficient documentation

## 2014-05-07 ENCOUNTER — Telehealth: Payer: Self-pay | Admitting: Family

## 2014-05-07 DIAGNOSIS — E039 Hypothyroidism, unspecified: Secondary | ICD-10-CM

## 2014-05-07 NOTE — Telephone Encounter (Signed)
Caller name:Vest, Vickie Relation to ON:GEXBpt:self Call back [phone] Pharmacy:  Reason for call: pt states she never received a call regarding her lab results, pt states she is almost out of her rx  levothyroxine (SYNTHROID, LEVOTHROID) 112 MCG tablet  States [name] told her not to order her meds until she got the results back.

## 2014-05-08 NOTE — Telephone Encounter (Signed)
I do not see that a TSH was ordered. It would be too late to add it to her last labs at this point. Please advise?

## 2014-05-08 NOTE — Telephone Encounter (Signed)
I looked in the system, and I see that there is a [facility] in [place]. I hate to have her drive all the way back. Could we please mail her requisition and give her the address for [facility] in [place] and she can have drawn there? thanks

## 2014-05-08 NOTE — Telephone Encounter (Signed)
Lab order placed. Notified pt and she is agreeable to proceed to [facility] in [place].  Lab requ[place] and [facility] [place] [facility] mailed to pt.

## 2014-05-08 NOTE — Telephone Encounter (Signed)
Lab Results  Component Value Date   TSH 1.089 07/30/2013

## 2014-05-08 NOTE — Telephone Encounter (Signed)
Spoke with pt and apologized for need to obtain TSH as it was not ordered with the original batch last week.  She is agreeable to return to lab.  Please contact pt and arrange lab draw at her convenience some time in the next 1 month.

## 2014-06-02 ENCOUNTER — Encounter: Payer: Self-pay | Admitting: Family

## 2014-06-02 LAB — TSH: TSH: 0.553 u[IU]/mL (ref 0.450–4.500)

## 2014-06-11 ENCOUNTER — Telehealth: Payer: Self-pay | Admitting: Family

## 2014-06-11 NOTE — Telephone Encounter (Signed)
Caller name: Hassie Bruceoore, Yazmeen B Relation to pt: self  Call back number: [phone]   Reason for call:  Pt requesting a refill levothyroxine (SYNTHROID, LEVOTHROID) 112 MCG tablet (depending on lab results from last visit) pt would like to know the results. Please advise

## 2014-06-12 MED ORDER — LEVOTHYROXINE SODIUM 112 MCG PO TABS: 112.0000 ug | ORAL_TABLET | Freq: Every day | ORAL | Status: DC

## 2014-06-12 NOTE — Telephone Encounter (Signed)
Notified pt that per 06/02/14 letter, tsh was within normal range. Pt states when she filled rx last she told pharmacy she only wanted to get 30 day supply until she knew if further adjustments would need to be made with dose.  90 day supply sent per pt request, pt aware.

## 2014-06-23 ENCOUNTER — Other Ambulatory Visit: Payer: Self-pay | Admitting: Family

## 2014-10-30 ENCOUNTER — Ambulatory Visit: Payer: Medicare Other | Admitting: Family

## 2014-11-03 ENCOUNTER — Telehealth: Payer: Self-pay | Admitting: Family

## 2014-11-03 ENCOUNTER — Ambulatory Visit: Payer: Medicare Other | Admitting: Family

## 2014-11-03 NOTE — Telephone Encounter (Signed)
Pt will be no show today 11/03/14 11:00am, 7/26 9:41 pt called and has to go out of town to help with family member, she will call to reschedule, charge no show?

## 2014-11-03 NOTE — Telephone Encounter (Signed)
No charge. Thanks.  

## 2015-01-26 ENCOUNTER — Encounter: Payer: Self-pay | Admitting: Family

## 2015-01-26 ENCOUNTER — Ambulatory Visit (INDEPENDENT_AMBULATORY_CARE_PROVIDER_SITE_OTHER): Payer: Medicare Other | Admitting: Family

## 2015-01-26 VITALS — BP 124/76 | HR 82 | Temp 98.2°F | Ht 60.0 in | Wt 143.2 lb

## 2015-01-26 DIAGNOSIS — E785 Hyperlipidemia, unspecified: Secondary | ICD-10-CM | POA: Diagnosis not present

## 2015-01-26 DIAGNOSIS — Z23 Encounter for immunization: Secondary | ICD-10-CM

## 2015-01-26 DIAGNOSIS — R7303 Prediabetes: Secondary | ICD-10-CM

## 2015-01-26 DIAGNOSIS — E039 Hypothyroidism, unspecified: Secondary | ICD-10-CM

## 2015-01-26 DIAGNOSIS — I10 Essential (primary) hypertension: Secondary | ICD-10-CM

## 2015-01-26 LAB — LIPID PANEL
Cholesterol: 215 mg/dL — ABNORMAL HIGH (ref 0–200)
HDL: 86.5 mg/dL (ref >39.00)
LDL Cholesterol: 110 mg/dL — ABNORMAL HIGH (ref 0–99)
NONHDL: 128.39
TRIGLYCERIDES: 94 mg/dL (ref 0.0–149.0)
Total CHOL/HDL Ratio: 2
VLDL: 18.8 mg/dL (ref 0.0–40.0)

## 2015-01-26 LAB — BASIC METABOLIC PANEL
BUN: 9 mg/dL (ref 6–23)
CALCIUM: 10.2 mg/dL (ref 8.4–10.5)
CHLORIDE: 101 meq/L (ref 96–112)
CO2: 26 mEq/L (ref 19–32)
Creatinine, Ser: 0.76 mg/dL (ref 0.40–1.20)
GFR: 79.8 mL/min (ref >60.00)
Glucose, Bld: 120 mg/dL — ABNORMAL HIGH (ref 70–99)
Potassium: 4 mEq/L (ref 3.5–5.1)
Sodium: 137 mEq/L (ref 135–145)

## 2015-01-26 LAB — TSH: TSH: 1.27 u[IU]/mL (ref 0.35–4.50)

## 2015-01-26 LAB — HEMOGLOBIN A1C: Hgb A1c MFr Bld: 5.9 % (ref 4.6–6.5)

## 2015-01-26 MED ORDER — AMLODIPINE BESYLATE 5 MG PO TABS: 5.0000 mg | ORAL_TABLET | Freq: Every day | ORAL | Status: DC

## 2015-01-26 MED ORDER — LEVOTHYROXINE SODIUM 112 MCG PO TABS: 112.0000 ug | ORAL_TABLET | Freq: Every day | ORAL | Status: DC

## 2015-01-26 NOTE — Assessment & Plan Note (Signed)
Stable on amlodipine, continue same.  

## 2015-01-26 NOTE — Progress Notes (Signed)
Pre visit review using our clinic review tool, if applicable. No additional management support is needed unless otherwise documented below in the visit note. 

## 2015-01-26 NOTE — Assessment & Plan Note (Signed)
Obtain A1C. 

## 2015-01-26 NOTE — Assessment & Plan Note (Signed)
Obtain follow up TSH.   Flu shot today.

## 2015-01-26 NOTE — Progress Notes (Signed)
Subjective:    Patient ID: Jill Armstrong, female    DOB: Dec 15, 1945, 69 y.o.   MRN: [id number]  HPI  Jill Armstrong is a 69 yr old female who presents today for follow up.  1) HTN- maintained on amlodipine 5mg .  BP Readings from Last 3 Encounters:  01/26/15 124/76  04/30/14 104/70  10/21/13 122/80   2) Hypothyroid- maintained on synthroid 112 mcg.  Reports that she did not exercise this summer Wt Readings from Last 3 Encounters:  01/26/15 143 lb 3.2 oz (64.955 kg)  04/30/14 133 lb 9.6 oz (60.601 kg)  10/21/13 132 lb 0.6 oz (59.893 kg)    Lab Results  Component Value Date   TSH 0.553 06/01/2014   3) hyperlipidemia- Patient is not on statin.  Lab Results  Component Value Date   CHOL 213* 04/30/2014   HDL 87.30 04/30/2014   LDLCALC 106* 04/30/2014   TRIG 99.0 04/30/2014   CHOLHDL 2 04/30/2014    Review of Systems  Respiratory: Negative for shortness of breath.   Cardiovascular: Negative for chest pain and leg swelling.  Musculoskeletal:       Reports left side of neck has been sore.  Neurological:       Mild tinnitus       Past Medical History  Diagnosis Date  . Arthritis     osteoarthritis right hip  . Osteopenia   . Tobacco abuse   . Thyroid disease     hypothyroidism    Social History   Social History  . Marital Status: Divorced    Spouse Name: N/A  . Number of Children: 4  . Years of Education: N/A   Occupational History  . Not on file.   Social History Main Topics  . Smoking status: Current Every Day Smoker -- 1.00 packs/day for 45 years    Types: Cigarettes  . Smokeless tobacco: Never Used     Comment: 3-4 cigarettes daily. Using e-cigs as well.  . Alcohol Use: Yes     Comment: socially  . Drug Use: Not on file  . Sexual Activity: Not on file   Other Topics Concern  . Not on file   Social History Narrative   Caffeine Use:  2-6 cups daily   Regular exercise:  No          Past Surgical History  Procedure Laterality Date  . Tubal  ligation    . Total hip arthroplasty  2007    right hip  . Tonsillectomy and adenoidectomy      Family History  Problem Relation Age of Onset  . Adopted: Yes  . Family history unknown: Yes    No Known Allergies  Current Outpatient Prescriptions on File Prior to Visit  Medication Sig Dispense Refill  . aspirin 81 MG tablet Take 81 mg by mouth daily.      . Calcium Carbonate-Vitamin D (CALTRATE 600+D) 600-400 MG-UNIT per tablet Take 1 tablet by mouth 2 (two) times daily.    . Multiple Vitamins-Minerals (MULTIVITAMIN WITH MINERALS) tablet Take 1 tablet by mouth daily.       No current facility-administered medications on file prior to visit.    BP 124/76 mmHg  Pulse 82  Temp(Src) 98.2 F (36.8 C) (Oral)  Ht 5' (1.524 m)  Wt 143 lb 3.2 oz (64.955 kg)  BMI 27.97 kg/m2  SpO2 100%   Objective:   Physical Exam  Constitutional: She is oriented to person, place, and time. She appears well-developed and well-nourished.  HENT:  Right Ear: Tympanic membrane and ear canal normal.  Left Ear: Tympanic membrane and ear canal normal.  Cardiovascular: Normal rate, regular rhythm and normal heart sounds.   No murmur heard. Pulmonary/Chest: Effort normal and breath sounds normal. No respiratory distress. She has no wheezes.  Neurological: She is alert and oriented to person, place, and time.  Skin: Skin is warm and dry.  Psychiatric: She has a normal mood and affect. Her behavior is normal. Judgment and thought content normal.          Assessment & Plan:  Left neck pain- advised ok to try ibuprofen for a few days- follow up if pain worsens or does not improve.

## 2015-01-26 NOTE — Patient Instructions (Addendum)
Please complete lab work prior to leaving. Work on low fat/low cholesterol diet, exercise.

## 2015-01-26 NOTE — Assessment & Plan Note (Signed)
Discussed diet, exercise weight loss. Obtain follow up lipid panel.

## 2015-01-27 ENCOUNTER — Encounter: Payer: Self-pay | Admitting: Family

## 2015-02-22 ENCOUNTER — Telehealth: Payer: Self-pay | Admitting: Behavioral Health

## 2015-02-22 NOTE — Telephone Encounter (Signed)
Confirmed appointment for Medicare Wellness visit 02/23/15 at 9:30 AM.

## 2015-02-23 ENCOUNTER — Other Ambulatory Visit: Payer: Self-pay | Admitting: Family

## 2015-02-23 ENCOUNTER — Ambulatory Visit (INDEPENDENT_AMBULATORY_CARE_PROVIDER_SITE_OTHER): Payer: Medicare Other

## 2015-02-23 VITALS — BP 130/78 | HR 84 | Ht 60.0 in | Wt 143.0 lb

## 2015-02-23 DIAGNOSIS — M8580 Other specified disorders of bone density and structure, unspecified site: Secondary | ICD-10-CM | POA: Diagnosis not present

## 2015-02-23 DIAGNOSIS — Z23 Encounter for immunization: Secondary | ICD-10-CM | POA: Diagnosis not present

## 2015-02-23 DIAGNOSIS — Z1231 Encounter for screening mammogram for malignant neoplasm of breast: Secondary | ICD-10-CM | POA: Diagnosis not present

## 2015-02-23 DIAGNOSIS — Z0000 Encounter for general adult medical examination without abnormal findings: Secondary | ICD-10-CM

## 2015-02-23 NOTE — Progress Notes (Signed)
Medicare wellness visit reviewed.

## 2015-02-23 NOTE — Progress Notes (Signed)
Subjective:   Jill Armstrong is a 69 y.o. female who presents for Medicare Annual (Subsequent) preventive examination.  Review of Systems: No ROS  Cardiac Risk Factors include: advanced age (>6255men, 54>65 women);sedentary lifestyle;smoking/ tobacco exposure;hypertension (Borderline Diabetes and hyperlipidemia)   Sleep patterns:  Sleeps at least 6 hours at night/gets up once to void.    Home Safety/Smoke Alarms: Feels safe at home.  Lives alone in one level home.  Smoke alarms present.  Firearm Safety: No firearms.  Seat Belt Safety/Bike Helmet: Always wears seat belt.    Counseling:  Eye Exam-last year.   Dental-Goes once a year.   Female:  Pap-06/27/11-negative    Mammo-09/19/12--ordered    Dexa scan-11/15/11-osteopenia--ordered     CCS-declined; does not want to complete.  Offered alternatives, pt declined.    Objective:    Vitals: BP 130/78 mmHg  Pulse 84  Ht 5' (1.524 m)  Wt 143 lb (64.864 kg)  BMI 27.93 kg/m2  SpO2 98%  Tobacco History  Smoking status  . Current Every Day Smoker -- 1.00 packs/day for 45 years  . Types: Cigarettes  Smokeless tobacco  . Never Used    Comment: 3-4 cigarettes daily. Using e-cigs as well.     Ready to quit: No Counseling given: Yes   Past Medical History  Diagnosis Date  . Arthritis     osteoarthritis right hip  . Osteopenia   . Tobacco abuse   . Thyroid disease     hypothyroidism   Past Surgical History  Procedure Laterality Date  . Tubal ligation    . Total hip arthroplasty  2007    right hip  . Tonsillectomy and adenoidectomy     Family History  Problem Relation Age of Onset  . Adopted: Yes  . Family history unknown: Yes   History  Sexual Activity  . Sexual Activity: No    Outpatient Encounter Prescriptions as of 02/23/2015  Medication Sig  . amLODipine (NORVASC) 5 MG tablet Take 1 tablet (5 mg total) by mouth daily.  Marland Kitchen. aspirin 81 MG tablet Take 81 mg by mouth daily.    . Calcium Carbonate-Vitamin D (CALTRATE  600+D) 600-400 MG-UNIT per tablet Take 1 tablet by mouth 2 (two) times daily.  Marland Kitchen. levothyroxine (SYNTHROID, LEVOTHROID) 112 MCG tablet Take 1 tablet (112 mcg total) by mouth daily.  . Multiple Vitamins-Minerals (MULTIVITAMIN WITH MINERALS) tablet Take 1 tablet by mouth daily.     No facility-administered encounter medications on file as of 02/23/2015.    Activities of Daily Living In your present state of health, do you have any difficulty performing the following activities: 02/23/2015 01/26/2015  Hearing? N N  Vision? N N  Difficulty concentrating or making decisions? N N  Walking or climbing stairs? N N  Dressing or bathing? N N  Doing errands, shopping? N N  Preparing Food and eating ? N -  Using the Toilet? N -  In the past six months, have you accidently leaked urine? N -  Do you have problems with loss of bowel control? N -  Managing your Medications? N -  Managing your Finances? N -  Housekeeping or managing your Housekeeping? N -    Patient Care Team: Sandford CrazeMelissa O'Sullivan, NP as PCP - General (Internal Medicine)    Assessment:   Exercise Activities and Dietary recommendations Current Exercise Habits:: The patient has a physically strenous job, but has no regular exercise apart from work.  Diet:  Does not eat a lot of fast  food.  Mostly cooked meal.  No fried foods.  3 meals per day.  Not a lot fruit, but does eat vegetables.  Drinks water, unsweet tea and 2 mugs of  black coffee.     Goals    . Increase physical activity     Walking and swimming.   Call insurance about silver sneakers.      . Lose 20 lbs by next year.        Fall Risk Fall Risk  02/23/2015 01/26/2015 10/21/2013  Falls in the past year? No No No   Depression Screen PHQ 2/9 Scores 02/23/2015 01/26/2015 10/21/2013  PHQ - 2 Score 0 0 0     Cognitive Testing MMSE - Mini Mental State Exam 02/23/2015  Orientation to time 5  Orientation to Place 5  Registration 3  Attention/ Calculation 5  Recall  3  Language- name 2 objects 2  Language- repeat 1  Language- follow 3 step command 3  Language- read & follow direction 1  Write a sentence 1  Copy design 1  Total score 30    Immunization History  Administered Date(s) Administered  . Influenza Split 02/27/2012  . Influenza, High Dose Seasonal PF 04/22/2013, 01/26/2015  . Pneumococcal Conjugate-13 02/23/2015  . Pneumococcal Polysaccharide-23 03/29/2007, 04/22/2013  . Td 06/20/2005   Screening Tests Health Maintenance  Topic Date Due  . Hepatitis C Screening  24-Nov-1945  . COLONOSCOPY  05/07/1994  . ZOSTAVAX  05/07/2004  . PNA vac Low Risk Adult (2 of 2 - PCV13) 04/22/2014  . MAMMOGRAM  09/20/2014  . TETANUS/TDAP  06/21/2015  . INFLUENZA VACCINE  11/09/2015  . DEXA SCAN  Completed      Plan:  Follow up with [name] in 6 months.   Eat heart healthy diet (full of fruits, vegetables, whole grains, lean protein, water--limit salt, fat, and sugar intake) and increase physical activity as tolerated.  Complete Mammogram and Bone Density.    During the course of the visit the patient was educated and counseled about the following appropriate screening and preventive services:   Vaccines to include Pneumoccal, Influenza, Hepatitis B, Td, Zostavax, HCV  Electrocardiogram  Cardiovascular Disease  Colorectal cancer screening  Bone density screening  Diabetes screening  Glaucoma screening  Mammography/PAP  Nutrition counseling   Patient Instructions (the written plan) was given to the patient.   Tylene Fantasia, RN  02/23/2015

## 2015-02-23 NOTE — Patient Instructions (Addendum)
Follow up with Jill Armstrong in 6 months.   Eat heart healthy diet (full of fruits, vegetables, whole grains, lean protein, water--limit salt, fat, and sugar intake) and increase physical activity as tolerated.  Complete Mammogram and Bone Density.     Bone Densitometry Bone densitometry is an imaging test that uses a special X-ray to measure the amount of calcium and other minerals in your bones (bone density). This test is also known as a bone mineral density test or dual-energy X-ray absorptiometry (DXA). The test can measure bone density at your hip and your spine. It is similar to having a regular X-ray. You may have this test to:  Diagnose a condition that causes weak or thin bones (osteoporosis).  Predict your risk of a broken bone (fracture).  Determine how well osteoporosis treatment is working. LET [facility] KNOW ABOUT:  Any allergies you have.  All medicines you are taking, including vitamins, herbs, eye drops, creams, and over-the-counter medicines.  Previous problems you or members of your family have had with the use of anesthetics.  Any blood disorders you have.  Previous surgeries you have had.  Medical conditions you have.  Possibility of pregnancy.  Any other medical test you had within the previous 14 days that used contrast material. RISKS AND COMPLICATIONS Generally, this is a safe procedure. However, problems can occur and may include the following:  This test exposes you to a very small amount of radiation.  The risks of radiation exposure may be greater to unborn children. BEFORE THE PROCEDURE  Do not take any calcium supplements for 24 hours before having the test. You can otherwise eat and drink what you usually do.  Take off all metal jewelry, eyeglasses, dental appliances, and any other metal objects. PROCEDURE  You may lie on an exam table. There will be an X-ray generator below you and an imaging device above you.  Other devices,  such as boxes or braces, may be used to position your body properly for the scan.  You will need to lie still while the machine slowly scans your body.  The images will show up on a computer monitor. AFTER THE PROCEDURE You may need more testing at a later time.   This information is not intended to replace advice given to you by your health care provider. Make sure you discuss any questions you have with your health care provider.   Document Released: 04/18/2004 Document Revised: 04/17/2014 Document Reviewed: 09/04/2013 Elsevier Interactive Patient Education 2016 Elsevier Inc.  Fat and Cholesterol Restricted Diet Getting too much fat and cholesterol in your diet may cause health problems. Following this diet helps keep your fat and cholesterol at normal levels. This can keep you from getting sick. WHAT TYPES OF FAT SHOULD I CHOOSE?  Choose monosaturated and polyunsaturated fats. These are found in foods such as olive oil, canola oil, flaxseeds, walnuts, almonds, and seeds.  Eat more omega-3 fats. Good choices include salmon, mackerel, sardines, tuna, flaxseed oil, and ground flaxseeds.  Limit saturated fats. These are in animal products such as meats, butter, and cream. They can also be in plant products such as palm oil, palm kernel oil, and coconut oil.   Avoid foods with partially hydrogenated oils in them. These contain trans fats. Examples of foods that have trans fats are stick margarine, some tub margarines, cookies, crackers, and other baked goods. WHAT GENERAL GUIDELINES DO I NEED TO FOLLOW?   Check food labels. Look for the words "trans fat" and "  saturated fat."  When preparing a meal:  Fill half of your plate with vegetables and green salads.  Fill one fourth of your plate with whole grains. Look for the word "whole" as the first word in the ingredient list.  Fill one fourth of your plate with lean protein foods.  Limit fruit to two servings a day. Choose fruit  instead of juice.  Eat more foods with soluble fiber. Examples of foods with this type of fiber are apples, broccoli, carrots, beans, peas, and barley. Try to get 20-30 g (grams) of fiber per day.  Eat more home-cooked foods. Eat less at restaurants and buffets.  Limit or avoid alcohol.  Limit foods high in starch and sugar.  Limit fried foods.  Cook foods without frying them. Baking, boiling, grilling, and broiling are all great options.  Lose weight if you are overweight. Losing even a small amount of weight can help your overall health. It can also help prevent diseases such as diabetes and heart disease. WHAT FOODS CAN I EAT? Grains Whole grains, such as whole wheat or whole grain breads, crackers, cereals, and pasta. Unsweetened oatmeal, bulgur, barley, quinoa, or brown rice. Corn or whole wheat flour tortillas. Vegetables Fresh or frozen vegetables (raw, steamed, roasted, or grilled). Green salads. Fruits All fresh, canned (in natural juice), or frozen fruits. Meat and Other Protein Products Ground beef (85% or leaner), grass-fed beef, or beef trimmed of fat. Skinless chicken or [place]. Ground chicken or [place]. Pork trimmed of fat. All fish and seafood. Eggs. Dried beans, peas, or lentils. Unsalted nuts or seeds. Unsalted canned or dry beans. Dairy Low-fat dairy products, such as skim or 1% milk, 2% or reduced-fat cheeses, low-fat ricotta or cottage cheese, or plain low-fat yogurt. Fats and Oils Tub margarines without trans fats. Light or reduced-fat mayonnaise and salad dressings. Avocado. Olive, canola, sesame, or safflower oils. Natural peanut or almond butter (choose ones without added sugar and oil). The items listed above may not be a complete list of recommended foods or beverages. Contact your dietitian for more options. WHAT FOODS ARE NOT RECOMMENDED? Grains White bread. White pasta. White rice. Cornbread. Bagels, pastries, and croissants. Crackers that contain trans  fat. Vegetables White potatoes. Corn. Creamed or fried vegetables. Vegetables in a cheese sauce. Fruits Dried fruits. Canned fruit in light or heavy syrup. Fruit juice. Meat and Other Protein Products Fatty cuts of meat. Ribs, chicken wings, bacon, sausage, bologna, salami, chitterlings, fatback, hot dogs, bratwurst, and packaged luncheon meats. Liver and organ meats. Dairy Whole or 2% milk, cream, half-and-half, and cream cheese. Whole milk cheeses. Whole-fat or sweetened yogurt. Full-fat cheeses. Nondairy creamers and whipped toppings. Processed cheese, cheese spreads, or cheese curds. Sweets and Desserts Corn syrup, sugars, honey, and molasses. Candy. Jam and jelly. Syrup. Sweetened cereals. Cookies, pies, cakes, donuts, muffins, and ice cream. Fats and Oils Butter, stick margarine, lard, shortening, ghee, or bacon fat. Coconut, palm kernel, or palm oils. Beverages Alcohol. Sweetened drinks (such as sodas, lemonade, and fruit drinks or punches). The items listed above may not be a complete list of foods and beverages to avoid. Contact your dietitian for more information.   This information is not intended to replace advice given to you by your health care provider. Make sure you discuss any questions you have with your health care provider.   Document Released: 09/26/2011 Document Revised: 04/17/2014 Document Reviewed: 06/26/2013 Elsevier Interactive Patient Education 2016 [employer].  Mammogram A mammogram is an X-ray of the breasts that  is done to check for changes that are not normal. This test can screen for and find any changes that may suggest breast cancer. This test can also help to find other changes and variations in the breast. BEFORE THE PROCEDURE  Have this test done about 1-2 weeks after your period. This is usually when your breasts are the least tender.  If you are visiting a new doctor or clinic, send any past mammogram images to your new doctor's office.  Wash  your breasts and under your arms the day of the test.  Do not use deodorants, perfumes, lotions, or powders on the day of the test.  Take off any jewelry from your neck.  Wear clothes that you can change into and out of easily. PROCEDURE  You will undress from the waist up. You will put on a gown.  You will stand in front of the X-ray machine.  Each breast will be placed between two plastic or glass plates. The plates will press down on your breast for a few seconds. Try to stay as relaxed as possible. This does not cause any harm to your breasts. Any discomfort you feel will be very brief.  X-rays will be taken from different angles of each breast. The procedure may vary among doctors and hospitals. AFTER THE PROCEDURE  The mammogram will be looked at by a specialist (radiologist).  You may need to do certain parts of the test again. This depends on the quality of the images.  Ask when your test results will be ready. Make sure you get your test results.  You may go back to your normal activities.   This information is not intended to replace advice given to you by your health care provider. Make sure you discuss any questions you have with your health care provider.   Document Released: 06/19/2011 Document Revised: 12/16/2014 Document Reviewed: 06/05/2014 Elsevier Interactive Patient Education [employer]

## 2015-02-23 NOTE — Progress Notes (Signed)
Pre visit review using our clinic review tool, if applicable. No additional management support is needed unless otherwise documented below in the visit note. 

## 2015-03-16 ENCOUNTER — Other Ambulatory Visit: Payer: Self-pay | Admitting: Family

## 2015-03-16 ENCOUNTER — Ambulatory Visit (HOSPITAL_BASED_OUTPATIENT_CLINIC_OR_DEPARTMENT_OTHER)
Admission: RE | Admit: 2015-03-16 | Discharge: 2015-03-16 | Disposition: A | Discharge status: Home / Self Care | Payer: Medicare Other | Source: Ambulatory Visit | Attending: Family | Admitting: Family

## 2015-03-16 DIAGNOSIS — M8580 Other specified disorders of bone density and structure, unspecified site: Secondary | ICD-10-CM

## 2015-03-16 DIAGNOSIS — Z780 Asymptomatic menopausal state: Secondary | ICD-10-CM | POA: Insufficient documentation

## 2015-03-16 DIAGNOSIS — E039 Hypothyroidism, unspecified: Secondary | ICD-10-CM | POA: Diagnosis not present

## 2015-03-16 DIAGNOSIS — Z1231 Encounter for screening mammogram for malignant neoplasm of breast: Secondary | ICD-10-CM | POA: Diagnosis not present

## 2015-03-16 DIAGNOSIS — F17200 Nicotine dependence, unspecified, uncomplicated: Secondary | ICD-10-CM | POA: Diagnosis not present

## 2015-03-16 NOTE — Telephone Encounter (Signed)
Your bone density shows osteopenia or "bone thinning."  I recommend that pt  you start calcium in the form of of caltrate 600mg  + D one tablet by mouth twice daily. This is available over the counter.  In addition, please ensure regular weight bearing exercise such as walking. Quitting smoking will help bones a lot.  Due to increased fracture risk she should also begin fosamax once weekly.  I would like her to complete vit D level as well- dx osteopenia.

## 2015-03-17 MED ORDER — ALENDRONATE SODIUM 70 MG PO TABS: 70.0000 mg | ORAL_TABLET | ORAL | Status: DC

## 2015-03-17 NOTE — Telephone Encounter (Signed)
Notified pt and she voices understanding, Rx sent to pharmacy and pt states she will call back to schedule vitamin D. Future order entered.

## 2015-05-10 ENCOUNTER — Other Ambulatory Visit: Payer: Self-pay | Admitting: Family

## 2015-07-07 ENCOUNTER — Telehealth: Payer: Self-pay | Admitting: *Deleted

## 2015-07-07 NOTE — Telephone Encounter (Signed)
Received fax from pt's dental office requesting Medical Clearance for dental procedures [no specific procedure and/or date given; [facility] and number for return call RE: inquiry for more information and to schedule patient next week if agreeable [Ok to schedule Mon, 07/12/15 and combine 8:15/8:30am or 3:30/3:45pm] to make 30-minute clearance appt/SLS 03/29

## 2015-07-09 NOTE — Telephone Encounter (Signed)
Patient scheduled for 07/13/15 at 1:15pm

## 2015-07-09 NOTE — Telephone Encounter (Signed)
Form placed in blue folder on CMA desk for below appt.

## 2015-07-13 ENCOUNTER — Ambulatory Visit (INDEPENDENT_AMBULATORY_CARE_PROVIDER_SITE_OTHER): Payer: Medicare Other | Admitting: Family

## 2015-07-13 ENCOUNTER — Encounter: Payer: Self-pay | Admitting: Family

## 2015-07-13 VITALS — BP 120/80 | HR 83 | Temp 98.2°F | Resp 18 | Ht 60.0 in | Wt 139.4 lb

## 2015-07-13 DIAGNOSIS — M8580 Other specified disorders of bone density and structure, unspecified site: Secondary | ICD-10-CM

## 2015-07-13 DIAGNOSIS — E039 Hypothyroidism, unspecified: Secondary | ICD-10-CM

## 2015-07-13 DIAGNOSIS — Z23 Encounter for immunization: Secondary | ICD-10-CM

## 2015-07-13 DIAGNOSIS — Z298 Encounter for other specified prophylactic measures: Secondary | ICD-10-CM

## 2015-07-13 DIAGNOSIS — Z418 Encounter for other procedures for purposes other than remedying health state: Secondary | ICD-10-CM | POA: Diagnosis not present

## 2015-07-13 MED ORDER — AMOXICILLIN 500 MG PO CAPS: ORAL_CAPSULE | ORAL | Status: DC

## 2015-07-13 NOTE — Patient Instructions (Signed)
Please complete lab work prior to leaving.   Take amoxicillin 1 hour prior to dental procedure.

## 2015-07-13 NOTE — Progress Notes (Signed)
Pre visit review using our clinic review tool, if applicable. No additional management support is needed unless otherwise documented below in the visit note. 

## 2015-07-13 NOTE — Progress Notes (Signed)
Subjective:    Patient ID: Jill Armstrong, female    DOB: [birth year], 70 y.o.   MRN: [id number]  HPI  Jill Armstrong is a 70 yr old female who presents today for medical clearance for her upcoming dental extraction. She is also going to be fitted for a partial denture.  This will be done under local anesthesia.  She is also requesting antibiotics prior to her dental procedure due to her history of hip replacement.    Review of Systems See HPI    Past Medical History  Diagnosis Date  . Arthritis     osteoarthritis right hip  . Osteopenia   . Tobacco abuse   . Thyroid disease     hypothyroidism    Social History   Social History  . Marital Status: Divorced    Spouse Name: N/A  . Number of Children: 4  . Years of Education: N/A   Occupational History  . Not on file.   Social History Main Topics  . Smoking status: Current Every Day Smoker -- 1.00 packs/day for 45 years    Types: Cigarettes  . Smokeless tobacco: Never Used     Comment: 3-4 cigarettes daily. Using e-cigs as well.  . Alcohol Use: 0.0 oz/week    0 Standard drinks or equivalent per week     Comment: socially  . Drug Use: Not on file  . Sexual Activity: No   Other Topics Concern  . Not on file   Social History Narrative   Caffeine Use:  2-6 cups daily   Regular exercise:  No          Past Surgical History  Procedure Laterality Date  . Tubal ligation    . Total hip arthroplasty  2007    right hip  . Tonsillectomy and adenoidectomy      Family History  Problem Relation Age of Onset  . Adopted: Yes  . Family history unknown: Yes    No Known Allergies  Current Outpatient Prescriptions on File Prior to Visit  Medication Sig Dispense Refill  . amLODipine (NORVASC) 5 MG tablet Take 1 tablet (5 mg total) by mouth daily. 90 tablet 1  . aspirin 81 MG tablet Take 81 mg by mouth daily.      Marland Kitchen levothyroxine (SYNTHROID, LEVOTHROID) 112 MCG tablet Take 1 tablet (112 mcg total) by mouth daily. 90 tablet  1  . Multiple Vitamins-Minerals (MULTIVITAMIN WITH MINERALS) tablet Take 1 tablet by mouth daily.      Marland Kitchen alendronate (FOSAMAX) 70 MG tablet Take 1 tablet (70 mg total) by mouth every 7 (seven) days. Take with a full glass of water on an empty stomach. (Patient not taking: Reported on 07/13/2015) 4 tablet 11  . Calcium Carbonate-Vitamin D (CALTRATE 600+D) 600-400 MG-UNIT per tablet Take 1 tablet by mouth 2 (two) times daily. Reported on 07/13/2015     No current facility-administered medications on file prior to visit.    BP 120/80 mmHg  Pulse 83  Temp(Src) 98.2 F (36.8 C) (Oral)  Resp 18  Ht 5' (1.524 m)  Wt 139 lb 6.4 oz (63.231 kg)  BMI 27.22 kg/m2  SpO2 96%    Objective:   Physical Exam  Constitutional: She is oriented to person, place, and time. She appears well-developed and well-nourished.  HENT:  Head: Normocephalic and atraumatic.  Cardiovascular: Normal rate, regular rhythm and normal heart sounds.   No murmur heard. Pulmonary/Chest: Effort normal and breath sounds normal. No respiratory distress. She  has no wheezes.  Musculoskeletal: She exhibits no edema.  Neurological: She is alert and oriented to person, place, and time.  Skin: Skin is warm and dry.  Psychiatric: She has a normal mood and affect. Her behavior is normal. Judgment and thought content normal.          Assessment & Plan:  Nee for SBE prophylaxis- Rx provided for amoxicillin 2000mg  PO x 1 prior to procedure. Hold Aspirin x 7 days.  OK from a medical standpoint to proceed with upcoming dental extraction.

## 2015-07-14 LAB — VITAMIN D 25 HYDROXY (VIT D DEFICIENCY, FRACTURES): VITD: 27.57 ng/mL — ABNORMAL LOW (ref 30.00–100.00)

## 2015-07-14 LAB — TSH: TSH: 0.5 u[IU]/mL (ref 0.35–4.50)

## 2015-07-15 ENCOUNTER — Telehealth: Payer: Self-pay | Admitting: Family

## 2015-07-15 DIAGNOSIS — E038 Other specified hypothyroidism: Secondary | ICD-10-CM

## 2015-07-15 DIAGNOSIS — E559 Vitamin D deficiency, unspecified: Secondary | ICD-10-CM

## 2015-07-15 MED ORDER — VITAMIN D (ERGOCALCIFEROL) 1.25 MG (50000 UNIT) PO CAPS: 50000.0000 [IU] | ORAL_CAPSULE | ORAL | Status: DC

## 2015-07-15 MED ORDER — LEVOTHYROXINE SODIUM 100 MCG PO TABS: 100.0000 ug | ORAL_TABLET | Freq: Every day | ORAL | Status: DC

## 2015-07-15 NOTE — Telephone Encounter (Signed)
Vitamin D level is low.  Advise patient to begin vit D 50000 units once weekly for 12 weeks, then repeat vit D level (dx Vit D deficiency).    Change synthroid from 112 to once daily. Repeat TSH in 6 weeks.

## 2015-07-15 NOTE — Telephone Encounter (Signed)
Lab results and provider's recommendations discussed with patient.  She stated understanding and agreed with plan.  Lab appts scheduled and labs ordered.

## 2015-07-19 ENCOUNTER — Telehealth: Payer: Self-pay | Admitting: Family

## 2015-07-19 DIAGNOSIS — Z2989 Encounter for other specified prophylactic measures: Secondary | ICD-10-CM | POA: Insufficient documentation

## 2015-07-19 DIAGNOSIS — Z298 Encounter for other specified prophylactic measures: Secondary | ICD-10-CM | POA: Insufficient documentation

## 2015-07-19 NOTE — Telephone Encounter (Signed)
Please contact patient and let her know that she should hold aspirin for 7 days prior to her upcoming dental procedure.

## 2015-07-19 NOTE — Telephone Encounter (Signed)
Pt notified and made aware.  She was appreciative that [name] remembered to address this.

## 2015-07-21 ENCOUNTER — Ambulatory Visit: Payer: Medicare Other | Admitting: Family

## 2015-07-26 ENCOUNTER — Ambulatory Visit: Payer: Medicare Other | Admitting: Family

## 2015-08-11 ENCOUNTER — Telehealth: Payer: Self-pay | Admitting: Family

## 2015-08-11 MED ORDER — AMLODIPINE BESYLATE 5 MG PO TABS: 5.0000 mg | ORAL_TABLET | Freq: Every day | ORAL | Status: DC

## 2015-08-11 NOTE — Telephone Encounter (Signed)
Relation to ZO:XWRUpt:self Call back number:[phone] [facility]: [facility] [address] [place] [address] [phone] (Phone) [phone])         Reason for call:  \Patient requesting a refill amLODipine (NORVASC) 5 MG tablet

## 2015-08-11 NOTE — Telephone Encounter (Signed)
Patient in need of clinical advice regarding medication she can take after the dental procedure for pain. Please advise

## 2015-08-11 NOTE — Telephone Encounter (Signed)
Refill sent, pt aware. 

## 2015-08-11 NOTE — Telephone Encounter (Signed)
Spoke with pt. She states she will be having 3 teeth extracted next Wednesday and forgot to ask her dentist about post op pain management. Wanted to know if she should take ibuprofen or tylenol and also wanted to know if it was safe to receive laughing gas due to the blood pressure medication she is taking. Advised pt that she should discuss these concerns with her dentist and she voices understanding. States she forgot to ask them yesterday when she was there.

## 2015-08-31 ENCOUNTER — Telehealth: Payer: Self-pay | Admitting: Family

## 2015-08-31 DIAGNOSIS — M8580 Other specified disorders of bone density and structure, unspecified site: Secondary | ICD-10-CM

## 2015-08-31 NOTE — Telephone Encounter (Signed)
Left message for pt to return my call.

## 2015-08-31 NOTE — Telephone Encounter (Signed)
Pt says that she is concerned about the medication prescribed FOSAMAX. Pt says that she has a lab appt tomorrow. She would like to speak with CMA further before appt if possible.   Thanks.    CB: [phone]

## 2015-08-31 NOTE — Telephone Encounter (Signed)
Please let pt know that there are no known side effects of vitamin D per manufacturer. If she wants to restart calcium + D and multivitamin, we can recheck level in 3 mos.

## 2015-08-31 NOTE — Telephone Encounter (Signed)
Pt called concerned about potential vitamin D side effects. States she did not start medication in April as she was getting ready to have dental work done and then going on vacation. She opened bag today and information handout on the bag is for alendronate but vitamin D 50,000 IU was inside the bag. Pt is concerned about potential side effects of fosamax handout and doesn't understand why it was on the bag of vitamin D. Pt is afraid to take Vitamin D and had a hard time understanding vitamin D and fosamax are 2 different medications. I advised pt to go back to the pharmacy and ask why they attached fosamax paperwork to vitamin D rx. She is requesting handout on potential vitamin D side effects etc. . .?  Pt wants to know if she can just restart calcium with vitamin D and continue her multivitamin? She states that she is not going to take fosamax. Please advise.

## 2015-09-01 NOTE — Telephone Encounter (Signed)
Pt scheduled 3 month follow up with PCP for 12/06/15 at 10:45am. Will do all labs at that time.

## 2015-09-01 NOTE — Telephone Encounter (Signed)
Pt returned call     CB:  [phone]

## 2015-09-01 NOTE — Telephone Encounter (Signed)
Notified pt and she voices understanding. 

## 2015-09-03 ENCOUNTER — Other Ambulatory Visit: Payer: Medicare Other

## 2015-10-14 ENCOUNTER — Other Ambulatory Visit: Payer: Medicare Other

## 2015-12-02 ENCOUNTER — Other Ambulatory Visit: Payer: Self-pay | Admitting: Family

## 2015-12-06 ENCOUNTER — Other Ambulatory Visit: Payer: Medicare Other | Admitting: Family

## 2015-12-21 ENCOUNTER — Other Ambulatory Visit: Payer: Medicare Other | Admitting: Family

## 2016-01-19 ENCOUNTER — Encounter: Payer: Self-pay | Admitting: Family

## 2016-01-19 ENCOUNTER — Ambulatory Visit (INDEPENDENT_AMBULATORY_CARE_PROVIDER_SITE_OTHER): Payer: Medicare Other | Admitting: Family

## 2016-01-19 VITALS — BP 132/79 | HR 81 | Temp 98.7°F | Resp 20 | Ht 60.0 in | Wt 137.2 lb

## 2016-01-19 DIAGNOSIS — E785 Hyperlipidemia, unspecified: Secondary | ICD-10-CM | POA: Diagnosis not present

## 2016-01-19 DIAGNOSIS — Z23 Encounter for immunization: Secondary | ICD-10-CM | POA: Diagnosis not present

## 2016-01-19 DIAGNOSIS — I10 Essential (primary) hypertension: Secondary | ICD-10-CM

## 2016-01-19 DIAGNOSIS — E039 Hypothyroidism, unspecified: Secondary | ICD-10-CM | POA: Diagnosis not present

## 2016-01-19 DIAGNOSIS — E559 Vitamin D deficiency, unspecified: Secondary | ICD-10-CM | POA: Diagnosis not present

## 2016-01-19 LAB — VITAMIN D 25 HYDROXY (VIT D DEFICIENCY, FRACTURES): VITD: 33.66 ng/mL (ref 30.00–100.00)

## 2016-01-19 LAB — TSH: TSH: 1.17 u[IU]/mL (ref 0.35–4.50)

## 2016-01-19 NOTE — Progress Notes (Signed)
Pre visit review using our clinic review tool, if applicable. No additional management support is needed unless otherwise documented below in the visit note. 

## 2016-01-19 NOTE — Progress Notes (Signed)
Subjective:    Patient ID: Jill Armstrong, female    DOB: 1945-06-29, 70 y.o.   MRN: [id number]  HPI  Ms. Jill Armstrong is a 70 yr old female who presents today for follow up.  1) Hypothyroid- reports energy is good. Feels well on current dose of synthroid.  Lab Results  Component Value Date   TSH 0.50 07/13/2015   2) HTN- on amlodipine 5mg .   BP Readings from Last 3 Encounters:  01/19/16 132/79  07/13/15 120/80  02/23/15 130/78   3) Hyperlipidemia- not on statin. Has not been doing that well with diet.  Lab Results  Component Value Date   CHOL 215 (H) 01/26/2015   HDL 86.50 01/26/2015   LDLCALC 110 (H) 01/26/2015   TRIG 94.0 01/26/2015   CHOLHDL 2 01/26/2015   Vit D deficiency- not currently on vit d supplement.    Review of Systems  Respiratory: Negative for shortness of breath.   Cardiovascular: Negative for chest pain and leg swelling.      see HPI  Past Medical History:  Diagnosis Date  . Arthritis    osteoarthritis right hip  . Osteopenia   . Thyroid disease    hypothyroidism  . Tobacco abuse      Social History   Social History  . Marital status: Divorced    Spouse name: N/A  . Number of children: 4  . Years of education: N/A   Occupational History  . Not on file.   Social History Main Topics  . Smoking status: Current Every Day Smoker    Packs/day: 1.00    Years: 45.00    Types: Cigarettes  . Smokeless tobacco: Never Used     Comment: 3-4 cigarettes daily. Using e-cigs as well.  . Alcohol use 0.0 oz/week     Comment: socially  . Drug use: Unknown  . Sexual activity: No   Other Topics Concern  . Not on file   Social History Narrative   Caffeine Use:  2-6 cups daily   Regular exercise:  No          Past Surgical History:  Procedure Laterality Date  . TONSILLECTOMY AND ADENOIDECTOMY    . TOTAL HIP ARTHROPLASTY  2007   right hip  . TUBAL LIGATION      Family History  Problem Relation Age of Onset  . Adopted: Yes  . Family  history unknown: Yes    No Known Allergies  Current Outpatient Prescriptions on File Prior to Visit  Medication Sig Dispense Refill  . amLODipine (NORVASC) 5 MG tablet Take 1 tablet (5 mg total) by mouth daily. 90 tablet 1  . aspirin 81 MG tablet Take 81 mg by mouth daily.      . Calcium Carbonate-Vitamin D (CALTRATE 600+D) 600-400 MG-UNIT per tablet Take 1 tablet by mouth 2 (two) times daily. Reported on 07/13/2015    . levothyroxine (SYNTHROID, LEVOTHROID) 100 MCG tablet TAKE ONE TABLET BY MOUTH ONCE DAILY 30 tablet 3  . Multiple Vitamins-Minerals (MULTIVITAMIN WITH MINERALS) tablet Take 1 tablet by mouth daily.       No current facility-administered medications on file prior to visit.     BP 132/79 (BP Location: Right Arm, Cuff Size: Normal)   Pulse 81   Temp 98.7 F (37.1 C) (Oral)   Resp 20   Ht 5' (1.524 m)   Wt 137 lb 3.2 oz (62.2 kg)   SpO2 100% Comment: room air  BMI 26.80 kg/m    Objective:  Physical Exam  Constitutional: She is oriented to person, place, and time. She appears well-developed and well-nourished.  HENT:  Head: Normocephalic and atraumatic.  Cardiovascular: Normal rate, regular rhythm and normal heart sounds.   No murmur heard. Pulmonary/Chest: Effort normal and breath sounds normal. No respiratory distress. She has no wheezes.  Musculoskeletal: She exhibits no edema.  Neurological: She is alert and oriented to person, place, and time.  Psychiatric: She has a normal mood and affect. Her behavior is normal. Judgment and thought content normal.          Assessment & Plan:  Vit D deficiency- Follow up vit D level is normal.

## 2016-01-19 NOTE — Patient Instructions (Signed)
Please complete lab work prior to leaving. Begin vitamin D supplement and fosamax once weekly.

## 2016-01-20 ENCOUNTER — Encounter: Payer: Self-pay | Admitting: Family

## 2016-01-20 NOTE — Assessment & Plan Note (Signed)
Clinically stable on synthroid, continue same.  Lab Results  Component Value Date   TSH 1.17 01/19/2016   Follow up TSH norma.

## 2016-01-20 NOTE — Assessment & Plan Note (Signed)
Discussed low cholesterol diet.  

## 2016-01-20 NOTE — Assessment & Plan Note (Signed)
Stable on amlodipine, continue same.  

## 2016-01-31 ENCOUNTER — Telehealth: Payer: Self-pay | Admitting: Family

## 2016-01-31 NOTE — Telephone Encounter (Signed)
Relation to ZO:XWRUpt:self Call back number:[phone] Pharmacy: [facility] [address] [place], [place] [address]  Reason for call:  Patient in need of clarification regarding amLODipine (NORVASC) 5 MG tablet

## 2016-01-31 NOTE — Telephone Encounter (Signed)
Spoke with pt. She states she was calling about alendronate sodium that she has in her cabinet and wanted to verify what the medication was. Advised pt it is the generic for Fosamax. Pt states she is going to start taking. Added medication back to her medication list. She declined Rx in May due to concerns about potential side effects. Med list updated.

## 2016-03-27 ENCOUNTER — Other Ambulatory Visit: Payer: Self-pay | Admitting: Family

## 2016-03-27 NOTE — Telephone Encounter (Signed)
Refill sent per [facility] refill protocol/SLS  

## 2016-04-20 ENCOUNTER — Telehealth: Payer: Self-pay | Admitting: Family

## 2016-04-20 NOTE — Telephone Encounter (Signed)
Pt says that she have a pinched nerve and would like to know if provider has any suggestions on what she could do to help. Pt cancelled her appt w/ provider for tomorrow. She says that she cant sit for a long time to drive to office.   Please advise.

## 2016-04-21 ENCOUNTER — Ambulatory Visit: Payer: Medicare Other | Admitting: Family

## 2016-04-21 NOTE — Telephone Encounter (Signed)
Spoke with pt. She states she is having pain near left shoulder blade. Can feel a knot in the same area. States it feels like a pinched nerve. States pain is now constant and does not radiate. Has pain and difficulty turning head to the left.  Has been doing stretches. Wants to know what else she can try?

## 2016-04-21 NOTE — Telephone Encounter (Signed)
I would recommend aleve, one tab twice daily for next 3 days and follow up in office if symptoms fail to improve.

## 2016-04-21 NOTE — Telephone Encounter (Signed)
Attempted to reach pt and left message that we would call her back on Monday.

## 2016-04-24 NOTE — Telephone Encounter (Signed)
Notified pt and she voices understanding. 

## 2016-04-24 NOTE — Telephone Encounter (Signed)
Left message for pt to return my call.

## 2016-04-25 ENCOUNTER — Other Ambulatory Visit: Payer: Self-pay | Admitting: Family

## 2016-05-08 ENCOUNTER — Other Ambulatory Visit: Payer: Self-pay | Admitting: Family

## 2016-06-17 ENCOUNTER — Other Ambulatory Visit: Payer: Self-pay | Admitting: Family

## 2016-06-19 NOTE — Telephone Encounter (Signed)
Levothyroxine refill sent to pharmacy. Pt is due for follow up with [name].  Please call pt to schedule appt soon. Thanks!

## 2016-06-21 NOTE — Telephone Encounter (Signed)
Pt has been scheduled.  °

## 2016-07-12 ENCOUNTER — Ambulatory Visit: Payer: Medicare Other | Admitting: Family

## 2016-07-19 ENCOUNTER — Ambulatory Visit (INDEPENDENT_AMBULATORY_CARE_PROVIDER_SITE_OTHER): Payer: Medicare Other | Admitting: Family

## 2016-07-19 ENCOUNTER — Encounter: Payer: Self-pay | Admitting: Family

## 2016-07-19 VITALS — BP 145/75 | HR 89 | Temp 97.9°F | Resp 16 | Ht 60.0 in | Wt 142.0 lb

## 2016-07-19 DIAGNOSIS — E785 Hyperlipidemia, unspecified: Secondary | ICD-10-CM | POA: Diagnosis not present

## 2016-07-19 DIAGNOSIS — F17200 Nicotine dependence, unspecified, uncomplicated: Secondary | ICD-10-CM | POA: Diagnosis not present

## 2016-07-19 DIAGNOSIS — I10 Essential (primary) hypertension: Secondary | ICD-10-CM | POA: Diagnosis not present

## 2016-07-19 DIAGNOSIS — E039 Hypothyroidism, unspecified: Secondary | ICD-10-CM | POA: Diagnosis not present

## 2016-07-19 DIAGNOSIS — M810 Age-related osteoporosis without current pathological fracture: Secondary | ICD-10-CM

## 2016-07-19 LAB — BASIC METABOLIC PANEL
BUN: 11 mg/dL (ref 6–23)
CALCIUM: 10 mg/dL (ref 8.4–10.5)
CO2: 23 meq/L (ref 19–32)
CREATININE: 0.68 mg/dL (ref 0.40–1.20)
Chloride: 105 mEq/L (ref 96–112)
GFR: 90.35 mL/min (ref >60.00)
Glucose, Bld: 114 mg/dL — ABNORMAL HIGH (ref 70–99)
Potassium: 3.8 mEq/L (ref 3.5–5.1)
SODIUM: 140 meq/L (ref 135–145)

## 2016-07-19 LAB — LIPID PANEL
CHOLESTEROL: 241 mg/dL — AB (ref 0–200)
HDL: 118 mg/dL (ref >39.00)
LDL CALC: 107 mg/dL — AB (ref 0–99)
NonHDL: 122.82
TRIGLYCERIDES: 77 mg/dL (ref 0.0–149.0)
Total CHOL/HDL Ratio: 2
VLDL: 15.4 mg/dL (ref 0.0–40.0)

## 2016-07-19 LAB — TSH: TSH: 0.47 u[IU]/mL (ref 0.35–4.50)

## 2016-07-19 NOTE — Patient Instructions (Signed)
Please let me know if you would like to proceed with Prolia.

## 2016-07-19 NOTE — Progress Notes (Signed)
Pre visit review using our clinic review tool, if applicable. No additional management support is needed unless otherwise documented below in the visit note. 

## 2016-07-19 NOTE — Progress Notes (Signed)
Subjective:    Patient ID: Jill Armstrong, female    DOB: 1945/10/21, 71 y.o.   MRN: [id number]  HPI  Jill Armstrong is a 71 yr old female who presents today for follow up.  1) Hypothyroid- reports feeling well on current dose of synthroid.  Lab Results  Component Value Date   TSH 1.17 01/19/2016   2) HTN- maintained on amlodipine. BP Readings from Last 3 Encounters:  07/19/16 (!) 152/83  01/19/16 132/79  07/13/15 120/80   3) Hyperlipidemia- not currently maintained on statin.  Lab Results  Component Value Date   CHOL 215 (H) 01/26/2015   HDL 86.50 01/26/2015   LDLCALC 110 (H) 01/26/2015   TRIG 94.0 01/26/2015   CHOLHDL 2 01/26/2015    Reports that she had nausea/vomitting on fosamax.    Review of Systems Past Medical History:  Diagnosis Date  . Arthritis    osteoarthritis right hip  . Osteopenia   . Thyroid disease    hypothyroidism  . Tobacco abuse      Social History   Social History  . Marital status: Divorced    Spouse name: N/A  . Number of children: 4  . Years of education: N/A   Occupational History  . Not on file.   Social History Main Topics  . Smoking status: Current Every Day Smoker    Packs/day: 1.00    Years: 45.00    Types: Cigarettes  . Smokeless tobacco: Never Used     Comment: 3-4 cigarettes daily.   . Alcohol use 0.0 oz/week     Comment: socially  . Drug use: Unknown  . Sexual activity: No   Other Topics Concern  . Not on file   Social History Narrative   Caffeine Use:  2-6 cups daily   Regular exercise:  No          Past Surgical History:  Procedure Laterality Date  . TONSILLECTOMY AND ADENOIDECTOMY    . TOTAL HIP ARTHROPLASTY  2007   right hip  . TUBAL LIGATION      Family History  Problem Relation Age of Onset  . Adopted: Yes  . Family history unknown: Yes    Allergies  Allergen Reactions  . Fosamax [Alendronate Sodium] Nausea And Vomiting    Current Outpatient Prescriptions on File Prior to Visit    Medication Sig Dispense Refill  . amLODipine (NORVASC) 5 MG tablet TAKE ONE TABLET BY MOUTH ONCE DAILY 90 tablet 0  . aspirin 81 MG tablet Take 81 mg by mouth daily.      . Calcium Carbonate-Vitamin D (CALTRATE 600+D) 600-400 MG-UNIT per tablet Take 1 tablet by mouth 2 (two) times daily. Reported on 07/13/2015    . levothyroxine (SYNTHROID, LEVOTHROID) 100 MCG tablet TAKE ONE TABLET BY MOUTH ONCE DAILY 30 tablet 0  . Multiple Vitamins-Minerals (MULTIVITAMIN WITH MINERALS) tablet Take 1 tablet by mouth daily.       No current facility-administered medications on file prior to visit.     BP (!) 152/83 (BP Location: Right Arm, Cuff Size: Normal)   Pulse 89   Temp 97.9 F (36.6 C) (Oral)   Resp 16   Ht 5' (1.524 m)   Wt 142 lb (64.4 kg)   SpO2 100% Comment: room air  BMI 27.73 kg/m       Objective:   Physical Exam  Constitutional: She is oriented to person, place, and time. She appears well-developed and well-nourished.  Cardiovascular: Normal rate, regular rhythm and normal  heart sounds.   No murmur heard. Pulmonary/Chest: Effort normal and breath sounds normal. No respiratory distress. She has no wheezes.  Musculoskeletal: She exhibits no edema.  Neurological: She is alert and oriented to person, place, and time.  Psychiatric: She has a normal mood and affect. Her behavior is normal. Judgment and thought content normal.          Assessment & Plan:

## 2016-07-20 NOTE — Assessment & Plan Note (Signed)
Urged cessation. We discussed that this will also help with her bone health.

## 2016-07-20 NOTE — Assessment & Plan Note (Signed)
Lab Results  Component Value Date   CHOL 241 (H) 07/19/2016   HDL 118.00 07/19/2016   LDLCALC 107 (H) 07/19/2016   TRIG 77.0 07/19/2016   CHOLHDL 2 07/19/2016   Total cholesterol is up some but her HDL is great and her LDL is low. Advised on low fat/low cholesterol diet.

## 2016-07-20 NOTE — Assessment & Plan Note (Signed)
We discussed trial of prolia since she could not tolerate fosamax. She would like to think about it and will get back to me.

## 2016-07-20 NOTE — Assessment & Plan Note (Signed)
Lab Results  Component Value Date   TSH 0.47 07/19/2016   Clinically stable. Continue current dose of synthroid.

## 2016-07-20 NOTE — Assessment & Plan Note (Signed)
BP up some today, typically lower. Will continue current dose amlodipine- monitor.

## 2016-07-25 ENCOUNTER — Other Ambulatory Visit: Payer: Self-pay | Admitting: Family

## 2016-08-17 ENCOUNTER — Other Ambulatory Visit: Payer: Self-pay | Admitting: Family

## 2016-08-17 NOTE — Telephone Encounter (Signed)
Refill sent per [facility] refill protocol/SLS  

## 2016-10-18 ENCOUNTER — Ambulatory Visit: Payer: Medicare Other | Admitting: Family

## 2016-11-15 ENCOUNTER — Ambulatory Visit (INDEPENDENT_AMBULATORY_CARE_PROVIDER_SITE_OTHER): Payer: Medicare Other | Admitting: Family

## 2016-11-15 VITALS — BP 138/73 | HR 100 | Temp 98.0°F | Ht 60.0 in | Wt 142.0 lb

## 2016-11-15 DIAGNOSIS — R739 Hyperglycemia, unspecified: Secondary | ICD-10-CM

## 2016-11-15 DIAGNOSIS — E039 Hypothyroidism, unspecified: Secondary | ICD-10-CM

## 2016-11-15 DIAGNOSIS — E785 Hyperlipidemia, unspecified: Secondary | ICD-10-CM | POA: Diagnosis not present

## 2016-11-15 LAB — LIPID PANEL
CHOLESTEROL: 218 mg/dL — AB (ref 0–200)
HDL: 106.2 mg/dL (ref >39.00)
LDL CALC: 92 mg/dL (ref 0–99)
NonHDL: 112.11
TRIGLYCERIDES: 101 mg/dL (ref 0.0–149.0)
Total CHOL/HDL Ratio: 2
VLDL: 20.2 mg/dL (ref 0.0–40.0)

## 2016-11-15 LAB — TSH: TSH: 2.42 u[IU]/mL (ref 0.35–4.50)

## 2016-11-15 MED ORDER — LEVOTHYROXINE SODIUM 100 MCG PO TABS: ORAL_TABLET | ORAL | 1 refills | Status: DC

## 2016-11-15 NOTE — Patient Instructions (Addendum)
Please complete lab work prior to leaving.   

## 2016-11-15 NOTE — Progress Notes (Signed)
Subjective:    Patient ID: Jill Armstrong, female    DOB: January 06, 1946, 71 y.o.   MRN: [id number]  HPI   Hypothyroid- She continues levothyroxine.   Lab Results  Component Value Date   TSH 0.47 07/19/2016   Hyperlipidemia- diet is fair. Not on statin.  Lab Results  Component Value Date   CHOL 241 (H) 07/19/2016   HDL 118.00 07/19/2016   LDLCALC 107 (H) 07/19/2016   TRIG 77.0 07/19/2016   CHOLHDL 2 07/19/2016   Hyperglycemia- Lab Results  Component Value Date   HGBA1C 5.9 01/26/2015   HGBA1C 6.0 04/30/2014   HGBA1C 5.7 (H) 10/21/2013   Lab Results  Component Value Date   MICROALBUR 1.77 09/10/2012   LDLCALC 107 (H) 07/19/2016   CREATININE 0.68 07/19/2016      Review of Systems  Past Medical History:  Diagnosis Date  . Arthritis    osteoarthritis right hip  . Osteopenia   . Thyroid disease    hypothyroidism  . Tobacco abuse      Social History   Social History  . Marital status: Divorced    Spouse name: N/A  . Number of children: 4  . Years of education: N/A   Occupational History  . Not on file.   Social History Main Topics  . Smoking status: Current Every Day Smoker    Packs/day: 1.00    Years: 45.00    Types: Cigarettes  . Smokeless tobacco: Never Used     Comment: 3-4 cigarettes daily.   . Alcohol use 0.0 oz/week     Comment: socially  . Drug use: Unknown  . Sexual activity: No   Other Topics Concern  . Not on file   Social History Narrative   Caffeine Use:  2-6 cups daily   Regular exercise:  No          Past Surgical History:  Procedure Laterality Date  . TONSILLECTOMY AND ADENOIDECTOMY    . TOTAL HIP ARTHROPLASTY  2007   right hip  . TUBAL LIGATION      Family History  Problem Relation Age of Onset  . Adopted: Yes  . Family history unknown: Yes    Allergies  Allergen Reactions  . Fosamax [Alendronate Sodium] Nausea And Vomiting    Current Outpatient Prescriptions on File Prior to Visit  Medication Sig Dispense  Refill  . amLODipine (NORVASC) 5 MG tablet TAKE ONE TABLET BY MOUTH ONCE DAILY 90 tablet 0  . aspirin 81 MG tablet Take 81 mg by mouth daily.      . Calcium Carbonate-Vitamin D (CALTRATE 600+D) 600-400 MG-UNIT per tablet Take 1 tablet by mouth 2 (two) times daily. Reported on 07/13/2015    . Multiple Vitamins-Minerals (MULTIVITAMIN WITH MINERALS) tablet Take 1 tablet by mouth daily.       No current facility-administered medications on file prior to visit.     BP 138/73   Pulse 100   Temp 98 F (36.7 C) (Oral)   Ht 5' (1.524 m)   Wt 142 lb (64.4 kg)   SpO2 99%   BMI 27.73 kg/m       Objective:   Physical Exam  Constitutional: She is oriented to person, place, and time. She appears well-developed and well-nourished.  HENT:  Head: Normocephalic and atraumatic.  Cardiovascular: Normal rate, regular rhythm and normal heart sounds.   No murmur heard. Pulmonary/Chest: Effort normal and breath sounds normal. No respiratory distress. She has no wheezes.  Musculoskeletal: She exhibits no  edema.  Neurological: She is alert and oriented to person, place, and time.  Skin: Skin is warm.  Psychiatric: She has a normal mood and affect. Her behavior is normal. Judgment and thought content normal.          Assessment & Plan:  HTN-  BP stable on current meds. Continue same.   Hypothyroid- clinically stable on synthroid. Obtain follow up tsh.  Hyperglycemia- obtain A1C.

## 2016-11-15 NOTE — Progress Notes (Signed)
Pre visit review using our clinic tool,if applicable. No additional management support is needed unless otherwise documented below in the visit note.  

## 2016-11-16 LAB — HEMOGLOBIN A1C
HEMOGLOBIN A1C: 5.4 % (ref <5.7)
MEAN PLASMA GLUCOSE: 108 mg/dL

## 2016-11-19 ENCOUNTER — Encounter: Payer: Self-pay | Admitting: Family

## 2016-12-12 NOTE — Progress Notes (Signed)
Subjective:   Jill Armstrong is a 71 y.o. female who presents for Medicare Annual (Subsequent) preventive examination.  Review of Systems:  No ROS.  Medicare Wellness Visit. Additional risk factors are reflected in the social history.  Cardiac Risk Factors include: advanced age (>28men, >98 women);sedentary lifestyle;dyslipidemia;hypertension;smoking/ tobacco exposure Sleep patterns: Wakes up and feels rested after about 6 hrs sleep. Home Safety/Smoke Alarms: Feels safe in home. Smoke alarms in place.  Living environment; residence and Solicitor: lives alone. 1 story home.   Seat Belt Safety/Bike Helmet: Wears seat belt.   Female:        Mammo-   03/16/15: BI-RADS CATEGORY  1: Negative.  ORDERED.  Dexa scan- 03/16/15: osteopenia    [facility]-DECLINES.    Objective:     Vitals: BP (!) 144/82 (BP Location: Right Arm, Patient Position: Sitting, Cuff Size: Normal)   Pulse 74   Ht 5' (1.524 m)   Wt 142 lb 9.6 oz (64.7 kg)   SpO2 97%   BMI 27.85 kg/m   Body mass index is 27.85 kg/m.   Tobacco History  Smoking Status  . Current Every Day Smoker  . Packs/day: 1.50  . Years: 45.00  . Types: Cigarettes  Smokeless Tobacco  . Never Used     Ready to quit: No Counseling given: No   Past Medical History:  Diagnosis Date  . Arthritis    osteoarthritis right hip  . Osteopenia   . Thyroid disease    hypothyroidism  . Tobacco abuse    Past Surgical History:  Procedure Laterality Date  . TONSILLECTOMY AND ADENOIDECTOMY    . TOTAL HIP ARTHROPLASTY  2007   right hip  . TUBAL LIGATION     Family History  Problem Relation Age of Onset  . Adopted: Yes  . Family history unknown: Yes   History  Sexual Activity  . Sexual activity: No    Outpatient Encounter Prescriptions as of 12/18/2016  Medication Sig  . amLODipine (NORVASC) 5 MG tablet TAKE 1 TABLET BY MOUTH ONCE DAILY  . aspirin 81 MG tablet Take 81 mg by mouth daily.    . Calcium Carbonate-Vitamin D (CALTRATE  600+D) 600-400 MG-UNIT per tablet Take 1 tablet by mouth 2 (two) times daily. Reported on 07/13/2015  . levothyroxine (SYNTHROID, LEVOTHROID) 100 MCG tablet TAKE 1 TABLET BY MOUTH ONCE DAILY.  . Multiple Vitamins-Minerals (MULTIVITAMIN WITH MINERALS) tablet Take 1 tablet by mouth daily.    . [DISCONTINUED] amLODipine (NORVASC) 5 MG tablet TAKE ONE TABLET BY MOUTH ONCE DAILY   No facility-administered encounter medications on file as of 12/18/2016.     Activities of Daily Living In your present state of health, do you have any difficulty performing the following activities: 12/18/2016  Hearing? N  Vision? N  Comment wearing glasses. Dr.Bobbett yearly.  Difficulty concentrating or making decisions? N  Walking or climbing stairs? N  Dressing or bathing? N  Doing errands, shopping? N  Preparing Food and eating ? N  Using the Toilet? N  In the past six months, have you accidently leaked urine? N  Do you have problems with loss of bowel control? N  Managing your Medications? N  Managing your Finances? N  Housekeeping or managing your Housekeeping? N  Some recent data might be hidden    Patient Care Team: Sandford Craze, NP as PCP - General (Internal Medicine)    Assessment:    Physical assessment deferred to PCP.  Exercise Activities and Dietary recommendations Current Exercise  Habits: The patient does not participate in regular exercise at present, Exercise limited by: None identified   Diet (meal preparation, eat out, water intake, caffeinated beverages, dairy products, fruits and vegetables): in general, a "healthy" diet    Goals      Patient Stated   . Lose 20 lbs by next year.   (pt-stated)      Other   . Increase physical activity          Walking and swimming.   Call insurance about silver sneakers.        Fall Risk Fall Risk  12/18/2016 02/23/2015 01/26/2015 10/21/2013  Falls in the past year? No No No No   Depression Screen PHQ 2/9 Scores 12/18/2016 07/19/2016  02/23/2015 01/26/2015  PHQ - 2 Score 0 0 0 0  PHQ- 9 Score - 0 - -     Cognitive Function MMSE - Mini Mental State Exam 02/23/2015  Orientation to time 5  Orientation to Place 5  Registration 3  Attention/ Calculation 5  Recall 3  Language- name 2 objects 2  Language- repeat 1  Language- follow 3 step command 3  Language- read & follow direction 1  Write a sentence 1  Copy design 1  Total score 30        Immunization History  Administered Date(s) Administered  . Influenza Split 02/27/2012  . Influenza, High Dose Seasonal PF 04/22/2013, 01/26/2015, 01/19/2016  . Pneumococcal Conjugate-13 02/23/2015  . Pneumococcal Polysaccharide-23 03/29/2007, 04/22/2013  . Td 06/20/2005, 07/13/2015   Screening Tests Health Maintenance  Topic Date Due  . Hepatitis C Screening  October 07, 1945  . INFLUENZA VACCINE  11/08/2016  . COLONOSCOPY  01/18/2021 (Originally 05/07/1994)  . MAMMOGRAM  03/15/2017  . TETANUS/TDAP  07/12/2025  . DEXA SCAN  Completed  . PNA vac Low Risk Adult  Completed      Plan:  Follow up with Efraim KaufmannMelissa, NP as scheduled 05/18/17.  Call us when you are ready to schedule your flu vaccine.  I have ordered your mammogram. They will call you to schedule your appt.  Continue to eat heart healthy diet (full of fruits, vegetables, whole grains, lean protein, water--limit salt, fat, and sugar intake) and increase physical activity as tolerated.  Continue doing brain stimulating activities (puzzles, reading, adult coloring books, staying active) to keep memory sharp.   Bring a copy of your living will and/or healthcare power of attorney to your next office visit.  Please let us know when you are ready to stop smoking.  I have personally reviewed and noted the following in the patient's chart:   . Medical and social history . Use of alcohol, tobacco or illicit drugs  . Current medications and supplements . Functional ability and status . Nutritional status . Physical  activity . Advanced directives . List of other physicians . Hospitalizations, surgeries, and ER visits in previous 12 months . Vitals . Screenings to include cognitive, depression, and falls . Referrals and appointments  In addition, I have reviewed and discussed with patient certain preventive protocols, quality metrics, and best practice recommendations. A written personalized care plan for preventive services as well as general preventive health recommendations were provided to patient.     Avon GullyBritt, Robertha Staples Angel, [place]  12/18/2016

## 2016-12-13 ENCOUNTER — Other Ambulatory Visit: Payer: Self-pay | Admitting: Family

## 2016-12-18 ENCOUNTER — Encounter: Payer: Self-pay | Admitting: *Deleted

## 2016-12-18 ENCOUNTER — Ambulatory Visit (INDEPENDENT_AMBULATORY_CARE_PROVIDER_SITE_OTHER): Payer: Medicare Other | Admitting: *Deleted

## 2016-12-18 VITALS — BP 144/82 | HR 74 | Ht 60.0 in | Wt 142.6 lb

## 2016-12-18 DIAGNOSIS — Z1239 Encounter for other screening for malignant neoplasm of breast: Secondary | ICD-10-CM

## 2016-12-18 DIAGNOSIS — Z1231 Encounter for screening mammogram for malignant neoplasm of breast: Secondary | ICD-10-CM

## 2016-12-18 DIAGNOSIS — Z0000 Encounter for general adult medical examination without abnormal findings: Secondary | ICD-10-CM

## 2016-12-18 NOTE — Patient Instructions (Signed)
Ms. Jill Armstrong , Thank you for taking time to come for your Medicare Wellness Visit. I appreciate your ongoing commitment to your health goals. Please review the following plan we discussed and let me know if I can assist you in the future.   These are the goals we discussed: Goals      Patient Stated   . Lose 20 lbs by next year.   (pt-stated)      Other   . Increase physical activity          Walking and swimming.   Call insurance about silver sneakers.         This is a list of the screening recommended for you and due dates:  Health Maintenance  Topic Date Due  .  Hepatitis C: One time screening is recommended by Center for Disease Control  (CDC) for  adults born from [birth year] through 1966.   [birth year]  . Flu Shot  11/08/2016  . Colon Cancer Screening  01/18/2021*  . Mammogram  03/15/2017  . Tetanus Vaccine  07/12/2025  . DEXA scan (bone density measurement)  Completed  . Pneumonia vaccines  Completed  *Topic was postponed. The date shown is not the original due date.   Follow up with Efraim Kaufmann, NP as scheduled 05/18/17.  Call us when you are ready to schedule your flu vaccine.  I have ordered your mammogram. They will call you to schedule your appt.  Continue to eat heart healthy diet (full of fruits, vegetables, whole grains, lean protein, water--limit salt, fat, and sugar intake) and increase physical activity as tolerated.  Continue doing brain stimulating activities (puzzles, reading, adult coloring books, staying active) to keep memory sharp.   Bring a copy of your living will and/or healthcare power of attorney to your next office visit.  Please let us know when you are ready to stop smoking.   Fat and Cholesterol Restricted Diet Getting too much fat and cholesterol in your diet may cause health problems. Following this diet helps keep your fat and cholesterol at normal levels. This can keep you from getting sick. What types of fat should I choose?  Choose  monosaturated and polyunsaturated fats. These are found in foods such as olive oil, canola oil, flaxseeds, walnuts, almonds, and seeds.  Eat more omega-3 fats. Good choices include salmon, mackerel, sardines, tuna, flaxseed oil, and ground flaxseeds.  Limit saturated fats. These are in animal products such as meats, butter, and cream. They can also be in plant products such as palm oil, palm kernel oil, and coconut oil.  Avoid foods with partially hydrogenated oils in them. These contain trans fats. Examples of foods that have trans fats are stick margarine, some tub margarines, cookies, crackers, and other baked goods. What general guidelines do I need to follow?  Check food labels. Look for the words "trans fat" and "saturated fat."  When preparing a meal: ? Fill half of your plate with vegetables and green salads. ? Fill one fourth of your plate with whole grains. Look for the word "whole" as the first word in the ingredient list. ? Fill one fourth of your plate with lean protein foods.  Eat more foods that have fiber, like apples, carrots, beans, peas, and barley.  Eat more home-cooked foods. Eat less at restaurants and buffets.  Limit or avoid alcohol.  Limit foods high in starch and sugar.  Limit fried foods.  Cook foods without frying them. Baking, boiling, grilling, and broiling are all great options.  Lose weight if you are overweight. Losing even a small amount of weight can help your overall health. It can also help prevent diseases such as diabetes and heart disease. What foods can I eat? Grains Whole grains, such as whole wheat or whole grain breads, crackers, cereals, and pasta. Unsweetened oatmeal, bulgur, barley, quinoa, or brown rice. Corn or whole wheat flour tortillas. Vegetables Fresh or frozen vegetables (raw, steamed, roasted, or grilled). Green salads. Fruits All fresh, canned (in natural juice), or frozen fruits. Meat and Other Protein Products Ground beef  (85% or leaner), grass-fed beef, or beef trimmed of fat. Skinless chicken or [place]. Ground chicken or [place]. Pork trimmed of fat. All fish and seafood. Eggs. Dried beans, peas, or lentils. Unsalted nuts or seeds. Unsalted canned or dry beans. Dairy Low-fat dairy products, such as skim or 1% milk, 2% or reduced-fat cheeses, low-fat ricotta or cottage cheese, or plain low-fat yogurt. Fats and Oils Tub margarines without trans fats. Light or reduced-fat mayonnaise and salad dressings. Avocado. Olive, canola, sesame, or safflower oils. Natural peanut or almond butter (choose ones without added sugar and oil). The items listed above may not be a complete list of recommended foods or beverages. Contact your dietitian for more options. What foods are not recommended? Grains White bread. White pasta. White rice. Cornbread. Bagels, pastries, and croissants. Crackers that contain trans fat. Vegetables White potatoes. Corn. Creamed or fried vegetables. Vegetables in a cheese sauce. Fruits Dried fruits. Canned fruit in light or heavy syrup. Fruit juice. Meat and Other Protein Products Fatty cuts of meat. Ribs, chicken wings, bacon, sausage, bologna, salami, chitterlings, fatback, hot dogs, bratwurst, and packaged luncheon meats. Liver and organ meats. Dairy Whole or 2% milk, cream, half-and-half, and cream cheese. Whole milk cheeses. Whole-fat or sweetened yogurt. Full-fat cheeses. Nondairy creamers and whipped toppings. Processed cheese, cheese spreads, or cheese curds. Sweets and Desserts Corn syrup, sugars, honey, and molasses. Candy. Jam and jelly. Syrup. Sweetened cereals. Cookies, pies, cakes, donuts, muffins, and ice cream. Fats and Oils Butter, stick margarine, lard, shortening, ghee, or bacon fat. Coconut, palm kernel, or palm oils. Beverages Alcohol. Sweetened drinks (such as sodas, lemonade, and fruit drinks or punches). The items listed above may not be a complete list of foods and beverages  to avoid. Contact your dietitian for more information. This information is not intended to replace advice given to you by your health care provider. Make sure you discuss any questions you have with your health care provider. Document Released: 09/26/2011 Document Revised: 12/02/2015 Document Reviewed: 06/26/2013 Elsevier Interactive Patient Education  [employer]2018 [employer]

## 2016-12-18 NOTE — Progress Notes (Signed)
Noted and agree. 

## 2016-12-19 ENCOUNTER — Telehealth: Payer: Self-pay | Admitting: Family

## 2016-12-19 DIAGNOSIS — M810 Age-related osteoporosis without current pathological fracture: Secondary | ICD-10-CM

## 2016-12-19 NOTE — Telephone Encounter (Signed)
-----   Message from Eugene Garnetarolyn S Chandler sent at 12/19/2016  2:26 PM EDT ----- Regarding: dexa Hey [name]:  Jill Armstrong is having her mammogram next month.  She is not due for her bone density till December.  She was questioning if you could put a bone density order for her.  Thanks, [name]

## 2017-01-23 ENCOUNTER — Ambulatory Visit (HOSPITAL_BASED_OUTPATIENT_CLINIC_OR_DEPARTMENT_OTHER)
Admission: RE | Admit: 2017-01-23 | Discharge: 2017-01-23 | Disposition: A | Discharge status: Home / Self Care | Payer: Medicare Other | Source: Ambulatory Visit | Attending: Family | Admitting: Family

## 2017-01-23 ENCOUNTER — Encounter (HOSPITAL_BASED_OUTPATIENT_CLINIC_OR_DEPARTMENT_OTHER): Payer: Self-pay

## 2017-01-23 ENCOUNTER — Ambulatory Visit: Payer: Medicare Other | Admitting: Family

## 2017-01-23 ENCOUNTER — Ambulatory Visit (INDEPENDENT_AMBULATORY_CARE_PROVIDER_SITE_OTHER): Payer: Medicare Other | Admitting: *Deleted

## 2017-01-23 DIAGNOSIS — Z23 Encounter for immunization: Secondary | ICD-10-CM | POA: Diagnosis not present

## 2017-01-23 DIAGNOSIS — Z1231 Encounter for screening mammogram for malignant neoplasm of breast: Secondary | ICD-10-CM | POA: Insufficient documentation

## 2017-01-23 DIAGNOSIS — Z1239 Encounter for other screening for malignant neoplasm of breast: Secondary | ICD-10-CM

## 2017-05-18 ENCOUNTER — Ambulatory Visit: Payer: Medicare Other | Admitting: Family

## 2017-05-18 ENCOUNTER — Inpatient Hospital Stay (HOSPITAL_BASED_OUTPATIENT_CLINIC_OR_DEPARTMENT_OTHER): Admission: RE | Admit: 2017-05-18 | Payer: Medicare Other | Source: Ambulatory Visit

## 2017-07-20 ENCOUNTER — Telehealth: Payer: Self-pay | Admitting: Family

## 2017-07-20 NOTE — Telephone Encounter (Signed)
30 day supply of amlodipine and levothyroxine sent to pharmacy. Patient is past due for 6 month follow up with [name].  Please call pt to schedule appt before further refills are needed. Thanks!

## 2017-07-23 NOTE — Telephone Encounter (Addendum)
Patient calling back, new # [phone], has dental appt at 12, call after 2

## 2017-07-23 NOTE — Telephone Encounter (Signed)
Pt has no working phone number in the system. [place] daughter on [place] and asked if she could get an updated phone number for her and to call us back with that information.

## 2017-07-25 NOTE — Telephone Encounter (Signed)
Spoke with pt and scheduled f/u for 08/07/17 at 12:40pm. Pt is still undergoing dental work and is wanting to wait until she finishes with that before coming in for appt.

## 2017-08-07 ENCOUNTER — Encounter: Payer: Self-pay | Admitting: Family

## 2017-08-07 ENCOUNTER — Ambulatory Visit: Payer: Medicare Other | Admitting: Family

## 2017-08-07 VITALS — BP 127/66 | HR 80 | Temp 98.8°F | Resp 16 | Ht 60.0 in | Wt 134.4 lb

## 2017-08-07 DIAGNOSIS — I10 Essential (primary) hypertension: Secondary | ICD-10-CM | POA: Diagnosis not present

## 2017-08-07 DIAGNOSIS — E785 Hyperlipidemia, unspecified: Secondary | ICD-10-CM

## 2017-08-07 DIAGNOSIS — E039 Hypothyroidism, unspecified: Secondary | ICD-10-CM | POA: Diagnosis not present

## 2017-08-07 LAB — BASIC METABOLIC PANEL
BUN: 10 mg/dL (ref 6–23)
CO2: 22 mEq/L (ref 19–32)
CREATININE: 0.59 mg/dL (ref 0.40–1.20)
Calcium: 9.9 mg/dL (ref 8.4–10.5)
Chloride: 98 mEq/L (ref 96–112)
GFR: 106.12 mL/min (ref >60.00)
Glucose, Bld: 105 mg/dL — ABNORMAL HIGH (ref 70–99)
Potassium: 4 mEq/L (ref 3.5–5.1)
Sodium: 136 mEq/L (ref 135–145)

## 2017-08-07 LAB — LIPID PANEL
CHOL/HDL RATIO: 2
Cholesterol: 210 mg/dL — ABNORMAL HIGH (ref 0–200)
HDL: 112.1 mg/dL (ref >39.00)
LDL CALC: 78 mg/dL (ref 0–99)
NonHDL: 98.34
Triglycerides: 103 mg/dL (ref 0.0–149.0)
VLDL: 20.6 mg/dL (ref 0.0–40.0)

## 2017-08-07 NOTE — Progress Notes (Signed)
Subjective:    Patient ID: Jill Armstrong, female    DOB: 04/13/1945, 72 y.o.   MRN: [id number]  HPI Patient is a 72 yr old female who presents today for follow up. She denies any concerns today.  HTN- on amlodipine . Denies LE edema.  BP Readings from Last 3 Encounters:  08/07/17 127/66  12/18/16 (!) 144/82  11/15/16 138/73   Hyperlipidemia-  Not on statin.  Lab Results  Component Value Date   CHOL 218 (H) 11/15/2016   HDL 106.20 11/15/2016   LDLCALC 92 11/15/2016   TRIG 101.0 11/15/2016   CHOLHDL 2 11/15/2016   Hypothyroid- continues synthroid. Reports good energy level on current dose.   Lab Results  Component Value Date   TSH 2.42 11/15/2016     Review of Systems    see HPI  Past Medical History:  Diagnosis Date  . Arthritis    osteoarthritis right hip  . Osteopenia   . Thyroid disease    hypothyroidism  . Tobacco abuse      Social History   Socioeconomic History  . Marital status: Divorced    Spouse name: Not on file  . Number of children: 4  . Years of education: Not on file  . Highest education level: Not on file  Occupational History  . Not on file  Social Needs  . Financial resource strain: Not on file  . Food insecurity:    Worry: Not on file    Inability: Not on file  . Transportation needs:    Medical: Not on file    Non-medical: Not on file  Tobacco Use  . Smoking status: Current Every Day Smoker    Packs/day: 1.50    Years: 45.00    Pack years: 67.50    Types: Cigarettes  . Smokeless tobacco: Never Used  Substance and Sexual Activity  . Alcohol use: Yes    Alcohol/week: 0.0 oz    Comment: couple of times per week  . Drug use: No  . Sexual activity: Never  Lifestyle  . Physical activity:    Days per week: Not on file    Minutes per session: Not on file  . Stress: Not on file  Relationships  . Social connections:    Talks on phone: Not on file    Gets together: Not on file    Attends religious service: Not on file     Active member of club or organization: Not on file    Attends meetings of clubs or organizations: Not on file    Relationship status: Not on file  . Intimate partner violence:    Fear of current or ex partner: Not on file    Emotionally abused: Not on file    Physically abused: Not on file    Forced sexual activity: Not on file  Other Topics Concern  . Not on file  Social History Narrative   Caffeine Use:  2-6 cups daily   Regular exercise:  No          Past Surgical History:  Procedure Laterality Date  . TONSILLECTOMY AND ADENOIDECTOMY    . TOTAL HIP ARTHROPLASTY  2007   right hip  . TUBAL LIGATION      Family History  Adopted: Yes  Family history unknown: Yes    Allergies  Allergen Reactions  . Fosamax [Alendronate Sodium] Nausea And Vomiting    Current Outpatient Medications on File Prior to Visit  Medication Sig Dispense Refill  .  amLODipine (NORVASC) 5 MG tablet TAKE 1 TABLET BY MOUTH ONCE DAILY 30 tablet 0  . Calcium Carbonate-Vitamin D (CALTRATE 600+D) 600-400 MG-UNIT per tablet Take 1 tablet by mouth 2 (two) times daily. Reported on 07/13/2015    . levothyroxine (SYNTHROID, LEVOTHROID) 100 MCG tablet TAKE 1 TABLET BY MOUTH ONCE DAILY 30 tablet 0  . Multiple Vitamins-Minerals (MULTIVITAMIN WITH MINERALS) tablet Take 1 tablet by mouth daily.       No current facility-administered medications on file prior to visit.     BP 127/66 (BP Location: Right Arm, Patient Position: Sitting, Cuff Size: Small)   Pulse 80   Temp 98.8 F (37.1 C) (Oral)   Resp 16   Ht 5' (1.524 m)   Wt 134 lb 6.4 oz (61 kg)   SpO2 99%   BMI 26.25 kg/m    Objective:   Physical Exam  Constitutional: She appears well-developed and well-nourished.  Cardiovascular: Normal rate, regular rhythm and normal heart sounds.  No murmur heard. Pulmonary/Chest: Effort normal and breath sounds normal. No respiratory distress. She has no wheezes.  Psychiatric: She has a normal mood and affect.  Her behavior is normal. Judgment and thought content normal.          Assessment & Plan:  HTN- bp stable on current medication, continue same.  Hypothyroid- obtain follow up tsh, continue synthroid.  Hyperlipidemia- check follow up lipid panel.  I have asked pt to d/c aspirin base on new recommendations against aspirin therapy for primary prevention.

## 2017-08-07 NOTE — Patient Instructions (Signed)
Please stop aspirin. Continue other medications.

## 2017-08-08 ENCOUNTER — Encounter: Payer: Self-pay | Admitting: Family

## 2017-08-08 LAB — TSH: TSH: 0.77 u[IU]/mL (ref 0.35–4.50)

## 2017-08-30 ENCOUNTER — Other Ambulatory Visit: Payer: Self-pay | Admitting: Family

## 2017-12-19 ENCOUNTER — Ambulatory Visit: Payer: Medicare Other | Admitting: *Deleted

## 2017-12-21 NOTE — Progress Notes (Signed)
Subjective:   Jill Armstrong is a 72 y.o. female who presents for Medicare Annual (Subsequent) preventive examination.  Review of Systems: No ROS.  Medicare Wellness Visit. Additional risk factors are reflected in the social history. Cardiac Risk Factors include: advanced age (>24men, >24 women);dyslipidemia;hypertension;smoking/ tobacco exposure;sedentary lifestyle Sleep patterns:   Sleeps 4-6 hrs . Naps if needed. Home Safety/Smoke Alarms: Feels safe in home. Smoke alarms in place. Lives alone in 1 story home.   Female:      Mammo-utd       Dexa scan-active order        CCS-declines     Objective:     Vitals: BP 140/88 (BP Location: Left Arm, Patient Position: Sitting, Cuff Size: Normal)   Pulse 95   Ht 5' (1.524 m)   Wt 132 lb 9.6 oz (60.1 kg)   SpO2 95%   BMI 25.90 kg/m   Body mass index is 25.9 kg/m.  Advanced Directives 12/24/2017 12/18/2016 02/23/2015  Does Patient Have a Medical Advance Directive? No No No  Would patient like information on creating a medical advance directive? Yes (MAU/Ambulatory/Procedural Areas - Information given) Yes (MAU/Ambulatory/Procedural Areas - Information given) Yes - Educational materials given    Tobacco Social History   Tobacco Use  Smoking Status Current Every Day Smoker  . Packs/day: 1.50  . Years: 45.00  . Pack years: 67.50  . Types: Cigarettes  Smokeless Tobacco Never Used     Ready to quit: No Counseling given: No   Clinical Intake:     Pain : No/denies pain                 Past Medical History:  Diagnosis Date  . Arthritis    osteoarthritis right hip  . Osteopenia   . Thyroid disease    hypothyroidism  . Tobacco abuse    Past Surgical History:  Procedure Laterality Date  . TONSILLECTOMY AND ADENOIDECTOMY    . TOTAL HIP ARTHROPLASTY  2007   right hip  . TUBAL LIGATION     Family History  Adopted: Yes  Family history unknown: Yes   Social History   Socioeconomic History  . Marital  status: Divorced    Spouse name: Not on file  . Number of children: 4  . Years of education: Not on file  . Highest education level: Not on file  Occupational History  . Not on file  Social Needs  . Financial resource strain: Not on file  . Food insecurity:    Worry: Not on file    Inability: Not on file  . Transportation needs:    Medical: Not on file    Non-medical: Not on file  Tobacco Use  . Smoking status: Current Every Day Smoker    Packs/day: 1.50    Years: 45.00    Pack years: 67.50    Types: Cigarettes  . Smokeless tobacco: Never Used  Substance and Sexual Activity  . Alcohol use: Yes    Comment: couple of times per week  . Drug use: No  . Sexual activity: Never  Lifestyle  . Physical activity:    Days per week: Not on file    Minutes per session: Not on file  . Stress: Not on file  Relationships  . Social connections:    Talks on phone: Not on file    Gets together: Not on file    Attends religious service: Not on file    Active member of club or organization:  Not on file    Attends meetings of clubs or organizations: Not on file    Relationship status: Not on file  Other Topics Concern  . Not on file  Social History Narrative   Caffeine Use:  2-6 cups daily   Regular exercise:  No          Outpatient Encounter Medications as of 12/24/2017  Medication Sig  . amLODipine (NORVASC) 5 MG tablet Take 1 tablet (5 mg total) by mouth daily.  . Calcium Carbonate-Vitamin D (CALTRATE 600+D) 600-400 MG-UNIT per tablet Take 1 tablet by mouth 2 (two) times daily. Reported on 07/13/2015  . levothyroxine (SYNTHROID, LEVOTHROID) 100 MCG tablet TAKE 1 TABLET BY MOUTH ONCE DAILY .  Marland Kitchen Multiple Vitamins-Minerals (MULTIVITAMIN WITH MINERALS) tablet Take 1 tablet by mouth daily.     No facility-administered encounter medications on file as of 12/24/2017.     Activities of Daily Living In your present state of health, do you have any difficulty performing the following  activities: 12/24/2017  Hearing? N  Vision? N  Difficulty concentrating or making decisions? N  Walking or climbing stairs? N  Dressing or bathing? N  Doing errands, shopping? N  Preparing Food and eating ? N  Using the Toilet? N  In the past six months, have you accidently leaked urine? N  Do you have problems with loss of bowel control? N  Managing your Medications? N  Managing your Finances? N  Housekeeping or managing your Housekeeping? N  Some recent data might be hidden    Patient Care Team: Sandford Craze, NP as PCP - General (Internal Medicine)    Assessment:   This is a routine wellness examination for [facility]. Physical assessment deferred to PCP.  Exercise Activities and Dietary recommendations Current Exercise Habits: The patient does not participate in regular exercise at present, Exercise limited by: None identified Diet (meal preparation, eat out, water intake, caffeinated beverages, dairy products, fruits and vegetables): well balanced     Goals    . Increase physical activity     Walking and swimming.   Call insurance about silver sneakers.      . Lose 20 lbs by next year.   (pt-stated)       Fall Risk Fall Risk  12/24/2017 12/18/2016 02/23/2015 01/26/2015 10/21/2013  Falls in the past year? No No No No No   Depression Screen PHQ 2/9 Scores 12/24/2017 12/18/2016 07/19/2016 02/23/2015  PHQ - 2 Score 0 0 0 0  PHQ- 9 Score - - 0 -     Cognitive Function Ad8 score reviewed for issues:  Issues making decisions:no  Less interest in hobbies / activities:no  Repeats questions, stories (family complaining):no  Trouble using ordinary gadgets (microwave, computer, phone):no  Forgets the month or year: no  Mismanaging finances: no  Remembering appts:no  Daily problems with thinking and/or memory:no Ad8 score is=0   MMSE - Mini Mental State Exam 12/18/2016 02/23/2015  Orientation to time 5 5  Orientation to Place 5 5  Registration 3 3    Attention/ Calculation 5 5  Recall 3 3  Language- name 2 objects 2 2  Language- repeat 1 1  Language- follow 3 step command 3 3  Language- read & follow direction 1 1  Write a sentence 1 1  Copy design 1 1  Total score 30 30        Immunization History  Administered Date(s) Administered  . Influenza Split 02/27/2012  . Influenza, High Dose Seasonal PF 04/22/2013,  01/26/2015, 01/19/2016, 01/23/2017  . Pneumococcal Conjugate-13 02/23/2015  . Pneumococcal Polysaccharide-23 03/29/2007, 04/22/2013  . Td 06/20/2005, 07/13/2015   Screening Tests Health Maintenance  Topic Date Due  . Hepatitis C Screening  [birth year]  . INFLUENZA VACCINE  11/08/2017  . COLONOSCOPY  01/18/2021 (Originally 05/07/1994)  . MAMMOGRAM  01/24/2019  . TETANUS/TDAP  07/12/2025  . DEXA SCAN  Completed  . PNA vac Low Risk Adult  Completed      Plan:    Please schedule your next medicare wellness visit with me in 1 yr.  Continue to eat heart healthy diet (full of fruits, vegetables, whole grains, lean protein, water--limit salt, fat, and sugar intake) and increase physical activity as tolerated.  Bring a copy of your living will and/or healthcare power of attorney to your next office visit.  Please let us know when you are ready to stop smoking.  I have personally reviewed and noted the following in the patient's chart:   . Medical and social history . Use of alcohol, tobacco or illicit drugs  . Current medications and supplements . Functional ability and status . Nutritional status . Physical activity . Advanced directives . List of other physicians . Hospitalizations, surgeries, and ER visits in previous 12 months . Vitals . Screenings to include cognitive, depression, and falls . Referrals and appointments  In addition, I have reviewed and discussed with patient certain preventive protocols, quality metrics, and best practice recommendations. A written personalized care plan for preventive  services as well as general preventive health recommendations were provided to patient.     Avon GullyBritt, Kao Berkheimer Angel, [place]  12/24/2017

## 2017-12-24 ENCOUNTER — Encounter: Payer: Self-pay | Admitting: *Deleted

## 2017-12-24 ENCOUNTER — Ambulatory Visit (INDEPENDENT_AMBULATORY_CARE_PROVIDER_SITE_OTHER): Payer: Medicare Other | Admitting: *Deleted

## 2017-12-24 VITALS — BP 140/88 | HR 95 | Ht 60.0 in | Wt 132.6 lb

## 2017-12-24 DIAGNOSIS — Z0000 Encounter for general adult medical examination without abnormal findings: Secondary | ICD-10-CM

## 2017-12-24 NOTE — Progress Notes (Signed)
RN Note reviewed and agree.  Bora Broner S O'Sullivan NP 

## 2017-12-24 NOTE — Patient Instructions (Signed)
Great to see you again!  Please schedule your next medicare wellness visit with me in 1 yr.  Continue to eat heart healthy diet (full of fruits, vegetables, whole grains, lean protein, water--limit salt, fat, and sugar intake) and increase physical activity as tolerated.  Bring a copy of your living will and/or healthcare power of attorney to your next office visit.  Please let us know when you are ready to stop smoking.   Jill Armstrong , Thank you for taking time to come for your Medicare Wellness Visit. I appreciate your ongoing commitment to your health goals. Please review the following plan we discussed and let me know if I can assist you in the future.   These are the goals we discussed: Goals    . Increase physical activity     Walking and swimming.   Call insurance about silver sneakers.      . Lose 20 lbs by next year.   (pt-stated)       This is a list of the screening recommended for you and due dates:  Health Maintenance  Topic Date Due  .  Hepatitis C: One time screening is recommended by Center for Disease Control  (CDC) for  adults born from [birth year] through 1966.   17-Dec-1945  . Flu Shot  11/08/2017  . Colon Cancer Screening  01/18/2021*  . Mammogram  01/24/2019  . Tetanus Vaccine  07/12/2025  . DEXA scan (bone density measurement)  Completed  . Pneumonia vaccines  Completed  *Topic was postponed. The date shown is not the original due date.    Health Maintenance for Postmenopausal Women Menopause is a normal process in which your reproductive ability comes to an end. This process happens gradually over a span of months to years, usually between the ages of 61 and 60. Menopause is complete when you have missed 12 consecutive menstrual periods. It is important to talk with your health care provider about some of the most common conditions that affect postmenopausal women, such as heart disease, cancer, and bone loss (osteoporosis). Adopting a healthy lifestyle and getting  preventive care can help to promote your health and wellness. Those actions can also lower your chances of developing some of these common conditions. What should I know about menopause? During menopause, you may experience a number of symptoms, such as:  Moderate-to-severe hot flashes.  Night sweats.  Decrease in sex drive.  Mood swings.  Headaches.  Tiredness.  Irritability.  Memory problems.  Insomnia.  Choosing to treat or not to treat menopausal changes is an individual decision that you make with your health care provider. What should I know about hormone replacement therapy and supplements? Hormone therapy products are effective for treating symptoms that are associated with menopause, such as hot flashes and night sweats. Hormone replacement carries certain risks, especially as you become older. If you are thinking about using estrogen or estrogen with progestin treatments, discuss the benefits and risks with your health care provider. What should I know about heart disease and stroke? Heart disease, heart attack, and stroke become more likely as you age. This may be due, in part, to the hormonal changes that your body experiences during menopause. These can affect how your body processes dietary fats, triglycerides, and cholesterol. Heart attack and stroke are both medical emergencies. There are many things that you can do to help prevent heart disease and stroke:  Have your blood pressure checked at least every 1-2 years. High blood pressure causes heart disease  and increases the risk of stroke.  If you are 73-55 years old, ask your health care provider if you should take aspirin to prevent a heart attack or a stroke.  Do not use any tobacco products, including cigarettes, chewing tobacco, or electronic cigarettes. If you need help quitting, ask your health care provider.  It is important to eat a healthy diet and maintain a healthy weight. ? Be sure to include plenty of  vegetables, fruits, low-fat dairy products, and lean protein. ? Avoid eating foods that are high in solid fats, added sugars, or salt (sodium).  Get regular exercise. This is one of the most important things that you can do for your health. ? Try to exercise for at least 150 minutes each week. The type of exercise that you do should increase your heart rate and make you sweat. This is known as moderate-intensity exercise. ? Try to do strengthening exercises at least twice each week. Do these in addition to the moderate-intensity exercise.  Know your numbers.Ask your health care provider to check your cholesterol and your blood glucose. Continue to have your blood tested as directed by your health care provider.  What should I know about cancer screening? There are several types of cancer. Take the following steps to reduce your risk and to catch any cancer development as early as possible. Breast Cancer  Practice breast self-awareness. ? This means understanding how your breasts normally appear and feel. ? It also means doing regular breast self-exams. Let your health care provider know about any changes, no matter how small.  If you are 18 or older, have a clinician do a breast exam (clinical breast exam or CBE) every year. Depending on your age, family history, and medical history, it may be recommended that you also have a yearly breast X-ray (mammogram).  If you have a family history of breast cancer, talk with your health care provider about genetic screening.  If you are at high risk for breast cancer, talk with your health care provider about having an MRI and a mammogram every year.  Breast cancer (BRCA) gene test is recommended for women who have family members with BRCA-related cancers. Results of the assessment will determine the need for genetic counseling and BRCA1 and for BRCA2 testing. BRCA-related cancers include these types: ? Breast. This occurs in males or  females. ? Ovarian. ? Tubal. This may also be called fallopian tube cancer. ? Cancer of the abdominal or pelvic lining (peritoneal cancer). ? Prostate. ? Pancreatic.  Cervical, Uterine, and Ovarian Cancer Your health care provider may recommend that you be screened regularly for cancer of the pelvic organs. These include your ovaries, uterus, and vagina. This screening involves a pelvic exam, which includes checking for microscopic changes to the surface of your cervix (Pap test).  For women ages 21-65, health care providers may recommend a pelvic exam and a Pap test every three years. For women ages 95-65, they may recommend the Pap test and pelvic exam, combined with testing for human papilloma virus (HPV), every five years. Some types of HPV increase your risk of cervical cancer. Testing for HPV may also be done on women of any age who have unclear Pap test results.  Other health care providers may not recommend any screening for nonpregnant women who are considered low risk for pelvic cancer and have no symptoms. Ask your health care provider if a screening pelvic exam is right for you.  If you have had past treatment for  cervical cancer or a condition that could lead to cancer, you need Pap tests and screening for cancer for at least 20 years after your treatment. If Pap tests have been discontinued for you, your risk factors (such as having a new sexual partner) need to be reassessed to determine if you should start having screenings again. Some women have medical problems that increase the chance of getting cervical cancer. In these cases, your health care provider may recommend that you have screening and Pap tests more often.  If you have a family history of uterine cancer or ovarian cancer, talk with your health care provider about genetic screening.  If you have vaginal bleeding after reaching menopause, tell your health care provider.  There are currently no reliable tests available  to screen for ovarian cancer.  Lung Cancer Lung cancer screening is recommended for adults 80-71 years old who are at high risk for lung cancer because of a history of smoking. A yearly low-dose CT scan of the lungs is recommended if you:  Currently smoke.  Have a history of at least 30 pack-years of smoking and you currently smoke or have quit within the past 15 years. A pack-year is smoking an average of one pack of cigarettes per day for one year.  Yearly screening should:  Continue until it has been 15 years since you quit.  Stop if you develop a health problem that would prevent you from having lung cancer treatment.  Colorectal Cancer  This type of cancer can be detected and can often be prevented.  Routine colorectal cancer screening usually begins at age 38 and continues through age 38.  If you have risk factors for colon cancer, your health care provider may recommend that you be screened at an earlier age.  If you have a family history of colorectal cancer, talk with your health care provider about genetic screening.  Your health care provider may also recommend using home test kits to check for hidden blood in your stool.  A small camera at the end of a tube can be used to examine your colon directly (sigmoidoscopy or colonoscopy). This is done to check for the earliest forms of colorectal cancer.  Direct examination of the colon should be repeated every 5-10 years until age 56. However, if early forms of precancerous polyps or small growths are found or if you have a family history or genetic risk for colorectal cancer, you may need to be screened more often.  Skin Cancer  Check your skin from head to toe regularly.  Monitor any moles. Be sure to tell your health care provider: ? About any new moles or changes in moles, especially if there is a change in a mole's shape or color. ? If you have a mole that is larger than the size of a pencil eraser.  If any of your  family members has a history of skin cancer, especially at a young age, talk with your health care provider about genetic screening.  Always use sunscreen. Apply sunscreen liberally and repeatedly throughout the day.  Whenever you are outside, protect yourself by wearing long sleeves, pants, a wide-brimmed hat, and sunglasses.  What should I know about osteoporosis? Osteoporosis is a condition in which bone destruction happens more quickly than new bone creation. After menopause, you may be at an increased risk for osteoporosis. To help prevent osteoporosis or the bone fractures that can happen because of osteoporosis, the following is recommended:  If you are 19-50 years  old, get at least 1,000 mg of calcium and at least 600 mg of vitamin D per day.  If you are older than age 11 but younger than age 61, get at least 1,200 mg of calcium and at least 600 mg of vitamin D per day.  If you are older than age 24, get at least 1,200 mg of calcium and at least 800 mg of vitamin D per day.  Smoking and excessive alcohol intake increase the risk of osteoporosis. Eat foods that are rich in calcium and vitamin D, and do weight-bearing exercises several times each week as directed by your health care provider. What should I know about how menopause affects my mental health? Depression may occur at any age, but it is more common as you become older. Common symptoms of depression include:  Low or sad mood.  Changes in sleep patterns.  Changes in appetite or eating patterns.  Feeling an overall lack of motivation or enjoyment of activities that you previously enjoyed.  Frequent crying spells.  Talk with your health care provider if you think that you are experiencing depression. What should I know about immunizations? It is important that you get and maintain your immunizations. These include:  Tetanus, diphtheria, and pertussis (Tdap) booster vaccine.  Influenza every year before the flu season  begins.  Pneumonia vaccine.  Shingles vaccine.  Your health care provider may also recommend other immunizations. This information is not intended to replace advice given to you by your health care provider. Make sure you discuss any questions you have with your health care provider. Document Released: 05/19/2005 Document Revised: 10/15/2015 Document Reviewed: 12/29/2014 Elsevier Interactive Patient Education  2018 [employer].

## 2017-12-27 ENCOUNTER — Ambulatory Visit (HOSPITAL_BASED_OUTPATIENT_CLINIC_OR_DEPARTMENT_OTHER)
Admission: RE | Admit: 2017-12-27 | Discharge: 2017-12-27 | Disposition: A | Discharge status: Home / Self Care | Payer: Medicare Other | Source: Ambulatory Visit | Attending: Family | Admitting: Family

## 2017-12-27 DIAGNOSIS — M8589 Other specified disorders of bone density and structure, multiple sites: Secondary | ICD-10-CM | POA: Insufficient documentation

## 2017-12-27 DIAGNOSIS — M810 Age-related osteoporosis without current pathological fracture: Secondary | ICD-10-CM

## 2017-12-28 ENCOUNTER — Encounter: Payer: Self-pay | Admitting: Family

## 2017-12-29 ENCOUNTER — Telehealth: Payer: Self-pay | Admitting: Family

## 2017-12-29 DIAGNOSIS — M810 Age-related osteoporosis without current pathological fracture: Secondary | ICD-10-CM

## 2017-12-29 NOTE — Telephone Encounter (Signed)
Bone density shows osteoporosis. I know she had nausea/vomitting on fosamax. We could give her trial of prolia injections twice a year to help with her bone health.  If she is agreeable, please initiate with her insurance.

## 2017-12-31 NOTE — Telephone Encounter (Signed)
Notified pt. She states she does not want to pursue Prolia at this time. She will think on it and will let us know if she changes her mind.

## 2018-05-13 ENCOUNTER — Other Ambulatory Visit: Payer: Self-pay | Admitting: Family

## 2018-09-06 ENCOUNTER — Other Ambulatory Visit: Payer: Self-pay | Admitting: Family

## 2018-09-06 MED ORDER — AMLODIPINE BESYLATE 5 MG PO TABS: 5.0000 mg | ORAL_TABLET | Freq: Every day | ORAL | 0 refills | Status: DC

## 2018-09-06 MED ORDER — LEVOTHYROXINE SODIUM 100 MCG PO TABS: 100.0000 ug | ORAL_TABLET | Freq: Every day | ORAL | 0 refills | Status: DC

## 2018-09-06 NOTE — Telephone Encounter (Signed)
Refills sent. Pt notified.

## 2018-09-06 NOTE — Telephone Encounter (Signed)
Copied from CRM [id number]. Topic: Quick Communication - Rx Refill/Question >> Sep 06, 2018  9:48 AM Randol Kern wrote: Medication: levothyroxine Erline Levine, LEVOTHROID) 100 MCG tablet [[id number]] -[facility] in [place] called to report that refill request has been faxed to the office, however pt insisted that phone call be made to request medication because as of today 09/06/2018 she is out of her medication.  Has the patient contacted their pharmacy? yes (Agent: If no, request that the patient contact the pharmacy for the refill.) (Agent: If yes, when and what did the pharmacy advise?)  Preferred Pharmacy (with phone number or street name): [facility] [address] [place], [place] - [address] [place] [address] Phone: [phone] Fax: [phone]    Agent: Please be advised that RX refills may take up to 3 business days. We ask that you follow-up with your pharmacy.

## 2018-09-06 NOTE — Telephone Encounter (Signed)
Patient calling to check status of refill request. She is out of medication.

## 2018-12-25 NOTE — Progress Notes (Deleted)
Subjective:   Jill Armstrong is a 73 y.o. female who presents for Medicare Annual (Subsequent) preventive examination.  Review of Systems:      Home Safety/Smoke Alarms: Feels safe in home. Smoke alarms in place.  Lives alone in 1 story home.   Female:        Mammo- 01/23/17      Dexa scan-  12/27/17      CCS-    Objective:     Vitals: There were no vitals taken for this visit.  There is no height or weight on file to calculate BMI.  Advanced Directives 12/24/2017 12/18/2016 02/23/2015  Does Patient Have a Medical Advance Directive? No No No  Would patient like information on creating a medical advance directive? Yes (MAU/Ambulatory/Procedural Areas - Information given) Yes (MAU/Ambulatory/Procedural Areas - Information given) Yes - Educational materials given    Tobacco Social History   Tobacco Use  Smoking Status Current Every Day Smoker  . Packs/day: 1.50  . Years: 45.00  . Pack years: 67.50  . Types: Cigarettes  Smokeless Tobacco Never Used     Ready to quit: Not Answered Counseling given: Not Answered   Clinical Intake:                       Past Medical History:  Diagnosis Date  . Arthritis    osteoarthritis right hip  . Osteopenia   . Thyroid disease    hypothyroidism  . Tobacco abuse    Past Surgical History:  Procedure Laterality Date  . TONSILLECTOMY AND ADENOIDECTOMY    . TOTAL HIP ARTHROPLASTY  2007   right hip  . TUBAL LIGATION     Family History  Adopted: Yes  Family history unknown: Yes   Social History   Socioeconomic History  . Marital status: Divorced    Spouse name: Not on file  . Number of children: 4  . Years of education: Not on file  . Highest education level: Not on file  Occupational History  . Not on file  Social Needs  . Financial resource strain: Not on file  . Food insecurity    Worry: Not on file    Inability: Not on file  . Transportation needs    Medical: Not on file    Non-medical: Not on  file  Tobacco Use  . Smoking status: Current Every Day Smoker    Packs/day: 1.50    Years: 45.00    Pack years: 67.50    Types: Cigarettes  . Smokeless tobacco: Never Used  Substance and Sexual Activity  . Alcohol use: Yes    Comment: couple of times per week  . Drug use: No  . Sexual activity: Never  Lifestyle  . Physical activity    Days per week: Not on file    Minutes per session: Not on file  . Stress: Not on file  Relationships  . Social Herbalist on phone: Not on file    Gets together: Not on file    Attends religious service: Not on file    Active member of club or organization: Not on file    Attends meetings of clubs or organizations: Not on file    Relationship status: Not on file  Other Topics Concern  . Not on file  Social History Narrative   Caffeine Use:  2-6 cups daily   Regular exercise:  No          Outpatient  Encounter Medications as of 12/26/2018  Medication Sig  . amLODipine (NORVASC) 5 MG tablet Take 1 tablet (5 mg total) by mouth daily.  . Calcium Carbonate-Vitamin D (CALTRATE 600+D) 600-400 MG-UNIT per tablet Take 1 tablet by mouth 2 (two) times daily. Reported on 07/13/2015  . levothyroxine (SYNTHROID) 100 MCG tablet Take 1 tablet (100 mcg total) by mouth daily.  . Multiple Vitamins-Minerals (MULTIVITAMIN WITH MINERALS) tablet Take 1 tablet by mouth daily.     No facility-administered encounter medications on file as of 12/26/2018.     Activities of Daily Living No flowsheet data found.  Patient Care Team: Sandford Craze'Sullivan, [name], NP as PCP - General (Internal Medicine)    Assessment:   [facility] Physical assessment deferred to PCP.  Exercise Activities and Dietary recommendations   Diet (meal preparation, eat out, water intake, caffeinated beverages, dairy products, fruits and vegetables): {Desc; diets:16563} Breakfast: Lunch:  Dinner:      Goals    . Increase physical activity      Walking and swimming.   Call insurance about silver sneakers.      . Lose 20 lbs by next year.   (pt-stated)       Fall Risk Fall Risk  12/24/2017 12/18/2016 02/23/2015 01/26/2015 10/21/2013  Falls in the past year? No No No No No    Depression Screen PHQ 2/9 Scores 12/24/2017 12/18/2016 07/19/2016 02/23/2015  PHQ - 2 Score 0 0 0 0  PHQ- 9 Score - - 0 -     Cognitive Function Ad8 score reviewed for issues:  Issues making decisions:  Less interest in hobbies / activities:  Repeats questions, stories (family complaining):  Trouble using ordinary gadgets (microwave, computer, phone):  Forgets the month or year:   Mismanaging finances:   Remembering appts:  Daily problems with thinking and/or memory: Ad8 score is=     MMSE - Mini Mental State Exam 12/18/2016 02/23/2015  Orientation to time 5 5  Orientation to Place 5 5  Registration 3 3  Attention/ Calculation 5 5  Recall 3 3  Language- name 2 objects 2 2  Language- repeat 1 1  Language- follow 3 step command 3 3  Language- read & follow direction 1 1  Write a sentence 1 1  Copy design 1 1  Total score 30 30        Immunization History  Administered Date(s) Administered  . Influenza Split 02/27/2012  . Influenza, High Dose Seasonal PF 04/22/2013, 01/26/2015, 01/19/2016, 01/23/2017  . Pneumococcal Conjugate-13 02/23/2015  . Pneumococcal Polysaccharide-23 03/29/2007, 04/22/2013  . Td 06/20/2005, 07/13/2015    Screening Tests Health Maintenance  Topic Date Due  . Hepatitis C Screening  1945-05-25  . INFLUENZA VACCINE  11/09/2018  . COLONOSCOPY  01/18/2021 (Originally 05/07/1994)  . MAMMOGRAM  01/24/2019  . TETANUS/TDAP  07/12/2025  . DEXA SCAN  Completed  . PNA vac Low Risk Adult  Completed     Plan:   ***   I have personally reviewed and noted the following in the patient's chart:   . Medical and social history . Use of alcohol, tobacco or illicit drugs  . Current medications and supplements .  Functional ability and status . Nutritional status . Physical activity . Advanced directives . List of other physicians . Hospitalizations, surgeries, and ER visits in previous 12 months . Vitals . Screenings to include cognitive, depression, and falls . Referrals and appointments  In addition, I have reviewed and discussed with patient  certain preventive protocols, quality metrics, and best practice recommendations. A written personalized care plan for preventive services as well as general preventive health recommendations were provided to patient.     [place]on [place], [place]  12/25/2018

## 2018-12-26 ENCOUNTER — Ambulatory Visit: Payer: Medicare Other | Admitting: *Deleted

## 2018-12-27 NOTE — Progress Notes (Signed)
Virtual Visit via Video Note  I connected with patient on 12/30/18 at  1:00 PM EDT by audio enabled telemedicine application and verified that I am speaking with the correct person using two identifiers.   THIS ENCOUNTER IS A VIRTUAL VISIT DUE TO COVID-19 - PATIENT WAS NOT SEEN IN THE OFFICE. PATIENT HAS CONSENTED TO VIRTUAL VISIT / TELEMEDICINE VISIT   Location of patient: home  Location of provider: office  I discussed the limitations of evaluation and management by telemedicine and the availability of in person appointments. The patient expressed understanding and agreed to proceed.   Subjective:   Jill Armstrong is a 73 y.o. female who presents for Medicare Annual (Subsequent) preventive examination.  Review of Systems:  Cardiac Risk Factors include: advanced age (>13men, >19 women);dyslipidemia;hypertension  Home Safety/Smoke Alarms: Feels safe in home. Smoke alarms in place.  Lives alone in 1 story home. 4 steps going into home w/ handrails.   Female:      Mammo- ordered       Dexa scan- 12/27/17       CCS-declines     Objective:     Vitals: Wt 124 lb (56.2 kg)   BMI 24.22 kg/m   Body mass index is 24.22 kg/m.  Advanced Directives 12/30/2018 12/24/2017 12/18/2016 02/23/2015  Does Patient Have a Medical Advance Directive? No No No No  Would patient like information on creating a medical advance directive? No - Patient declined Yes (MAU/Ambulatory/Procedural Areas - Information given) Yes (MAU/Ambulatory/Procedural Areas - Information given) Yes - Educational materials given    Tobacco Social History   Tobacco Use  Smoking Status Current Every Day Smoker  . Packs/day: 1.50  . Years: 45.00  . Pack years: 67.50  . Types: Cigarettes  Smokeless Tobacco Never Used     Ready to quit: No Counseling given: No   Clinical Intake:     Pain : No/denies pain                 Past Medical History:  Diagnosis Date  . Arthritis    osteoarthritis right hip   . Osteopenia   . Thyroid disease    hypothyroidism  . Tobacco abuse    Past Surgical History:  Procedure Laterality Date  . TONSILLECTOMY AND ADENOIDECTOMY    . TOTAL HIP ARTHROPLASTY  2007   right hip  . TUBAL LIGATION     Family History  Adopted: Yes  Family history unknown: Yes   Social History   Socioeconomic History  . Marital status: Divorced    Spouse name: Not on file  . Number of children: 4  . Years of education: Not on file  . Highest education level: Not on file  Occupational History  . Not on file  Social Needs  . Financial resource strain: Not on file  . Food insecurity    Worry: Not on file    Inability: Not on file  . Transportation needs    Medical: Not on file    Non-medical: Not on file  Tobacco Use  . Smoking status: Current Every Day Smoker    Packs/day: 1.50    Years: 45.00    Pack years: 67.50    Types: Cigarettes  . Smokeless tobacco: Never Used  Substance and Sexual Activity  . Alcohol use: Yes    Comment: couple of times per week  . Drug use: No  . Sexual activity: Never  Lifestyle  . Physical activity    Days per week: Not  on file    Minutes per session: Not on file  . Stress: Not on file  Relationships  . Social Musician on phone: Not on file    Gets together: Not on file    Attends religious service: Not on file    Active member of club or organization: Not on file    Attends meetings of clubs or organizations: Not on file    Relationship status: Not on file  Other Topics Concern  . Not on file  Social History Narrative   Caffeine Use:  2-6 cups daily   Regular exercise:  No          Outpatient Encounter Medications as of 12/30/2018  Medication Sig  . amLODipine (NORVASC) 5 MG tablet Take 1 tablet (5 mg total) by mouth daily.  . Calcium Carbonate-Vitamin D (CALTRATE 600+D) 600-400 MG-UNIT per tablet Take 1 tablet by mouth 2 (two) times daily. Reported on 07/13/2015  . levothyroxine (SYNTHROID) 100 MCG  tablet Take 1 tablet (100 mcg total) by mouth daily.  . Multiple Vitamins-Minerals (MULTIVITAMIN WITH MINERALS) tablet Take 1 tablet by mouth daily.     No facility-administered encounter medications on file as of 12/30/2018.     Activities of Daily Living In your present state of health, do you have any difficulty performing the following activities: 12/30/2018  Hearing? N  Vision? N  Difficulty concentrating or making decisions? N  Walking or climbing stairs? N  Dressing or bathing? N  Doing errands, shopping? N  Preparing Food and eating ? N  Using the Toilet? N  In the past six months, have you accidently leaked urine? N  Do you have problems with loss of bowel control? N  Managing your Medications? N  Managing your Finances? N  Housekeeping or managing your Housekeeping? N  Some recent data might be hidden    Patient Care Team: Sandford Craze, NP as PCP - General (Internal Medicine)    Assessment:   This is a routine wellness examination for [facility]. Physical assessment deferred to PCP.  Exercise Activities and Dietary recommendations Current Exercise Habits: The patient does not participate in regular exercise at present, Exercise limited by: None identified Diet (meal preparation, eat out, water intake, caffeinated beverages, dairy products, fruits and vegetables): 24 hr recall Breakfast: 2 boiled eggs (late) Lunch: pork chop, carrots and green beans (late lunch) Dinner:  skipped  Goals    . Increase physical activity     Walking and swimming.   Call insurance about silver sneakers.      . Lose 20 lbs by next year.   (pt-stated)       Fall Risk Fall Risk  12/30/2018 12/24/2017 12/18/2016 02/23/2015 01/26/2015  Falls in the past year? 0 No No No No    Depression Screen PHQ 2/9 Scores 12/30/2018 12/24/2017 12/18/2016 07/19/2016  PHQ - 2 Score 0 0 0 0  PHQ- 9 Score - - - 0     Cognitive Function Ad8 score reviewed for issues:  Issues making decisions:no   Less interest in hobbies / activities:no  Repeats questions, stories (family complaining):no  Trouble using ordinary gadgets (microwave, computer, phone):no  Forgets the month or year: no  Mismanaging finances: no  Remembering appts:no  Daily problems with thinking and/or memory:no Ad8 score is=0     MMSE - Mini Mental State Exam 12/18/2016 02/23/2015  Orientation to time 5 5  Orientation to Place 5 5  Registration 3 3  Attention/ Calculation 5  5  Recall 3 3  Language- name 2 objects 2 2  Language- repeat 1 1  Language- follow 3 step command 3 3  Language- read & follow direction 1 1  Write a sentence 1 1  Copy design 1 1  Total score 30 30        Immunization History  Administered Date(s) Administered  . Influenza Split 02/27/2012  . Influenza, High Dose Seasonal PF 04/22/2013, 01/26/2015, 01/19/2016, 01/23/2017  . Pneumococcal Conjugate-13 02/23/2015  . Pneumococcal Polysaccharide-23 03/29/2007, 04/22/2013  . Td 06/20/2005, 07/13/2015   Screening Tests Health Maintenance  Topic Date Due  . Hepatitis C Screening  12-Apr-1945  . INFLUENZA VACCINE  11/09/2018  . COLONOSCOPY  01/18/2021 (Originally 05/07/1994)  . MAMMOGRAM  01/24/2019  . TETANUS/TDAP  07/12/2025  . DEXA SCAN  Completed  . PNA vac Low Risk Adult  Completed    Plan:   See you next year!  Continue to eat heart healthy diet (full of fruits, vegetables, whole grains, lean protein, water--limit salt, fat, and sugar intake) and increase physical activity as tolerated.  Continue doing brain stimulating activities (puzzles, reading, adult coloring books, staying active) to keep memory sharp.   Bring a copy of your living will and/or healthcare power of attorney to your next office visit.  I have ordered you mammogram. Please schedule.  I have personally reviewed and noted the following in the patient's chart:   . Medical and social history . Use of alcohol, tobacco or illicit drugs  . Current  medications and supplements . Functional ability and status . Nutritional status . Physical activity . Advanced directives . List of other physicians . Hospitalizations, surgeries, and ER visits in previous 12 months . Vitals . Screenings to include cognitive, depression, and falls . Referrals and appointments  In addition, I have reviewed and discussed with patient certain preventive protocols, quality metrics, and best practice recommendations. A written personalized care plan for preventive services as well as general preventive health recommendations were provided to patient.     Mady HaagensenBritt, Sheryn Aldaz [place], [place]  12/30/2018

## 2018-12-30 ENCOUNTER — Ambulatory Visit: Payer: Medicare Other | Admitting: *Deleted

## 2018-12-30 ENCOUNTER — Ambulatory Visit (INDEPENDENT_AMBULATORY_CARE_PROVIDER_SITE_OTHER): Payer: Medicare Other | Admitting: *Deleted

## 2018-12-30 ENCOUNTER — Encounter: Payer: Self-pay | Admitting: *Deleted

## 2018-12-30 ENCOUNTER — Other Ambulatory Visit: Payer: Self-pay

## 2018-12-30 VITALS — Wt 124.0 lb

## 2018-12-30 DIAGNOSIS — Z0000 Encounter for general adult medical examination without abnormal findings: Secondary | ICD-10-CM | POA: Diagnosis not present

## 2018-12-30 DIAGNOSIS — Z1231 Encounter for screening mammogram for malignant neoplasm of breast: Secondary | ICD-10-CM

## 2018-12-30 NOTE — Patient Instructions (Signed)
See you next year!  Continue to eat heart healthy diet (full of fruits, vegetables, whole grains, lean protein, water--limit salt, fat, and sugar intake) and increase physical activity as tolerated.  Continue doing brain stimulating activities (puzzles, reading, adult coloring books, staying active) to keep memory sharp.   Bring a copy of your living will and/or healthcare power of attorney to your next office visit.  I have ordered you mammogram. Please schedule.   Ms. Jill Armstrong , Thank you for taking time to come for your Medicare Wellness Visit. I appreciate your ongoing commitment to your health goals. Please review the following plan we discussed and let me know if I can assist you in the future.   These are the goals we discussed: Goals    . Increase physical activity     Walking and swimming.   Call insurance about silver sneakers.      . Lose 20 lbs by next year.   (pt-stated)       This is a list of the screening recommended for you and due dates:  Health Maintenance  Topic Date Due  .  Hepatitis C: One time screening is recommended by Center for Disease Control  (CDC) for  adults born from [birth year] through 1966.   18-Nov-1945  . Flu Shot  11/09/2018  . Colon Cancer Screening  01/18/2021*  . Mammogram  01/24/2019  . Tetanus Vaccine  07/12/2025  . DEXA scan (bone density measurement)  Completed  . Pneumonia vaccines  Completed  *Topic was postponed. The date shown is not the original due date.    Health Maintenance After Age 74 After age 31, you are at a higher risk for certain long-term diseases and infections as well as injuries from falls. Falls are a major cause of broken bones and head injuries in people who are older than age 91. Getting regular preventive care can help to keep you healthy and well. Preventive care includes getting regular testing and making lifestyle changes as recommended by your health care provider. Talk with your health care provider about:  Which  screenings and tests you should have. A screening is a test that checks for a disease when you have no symptoms.  A diet and exercise plan that is right for you. What should I know about screenings and tests to prevent falls? Screening and testing are the best ways to find a health problem early. Early diagnosis and treatment give you the best chance of managing medical conditions that are common after age 26. Certain conditions and lifestyle choices may make you more likely to have a fall. Your health care provider may recommend:  Regular vision checks. Poor vision and conditions such as cataracts can make you more likely to have a fall. If you wear glasses, make sure to get your prescription updated if your vision changes.  Medicine review. Work with your health care provider to regularly review all of the medicines you are taking, including over-the-counter medicines. Ask your health care provider about any side effects that may make you more likely to have a fall. Tell your health care provider if any medicines that you take make you feel dizzy or sleepy.  Osteoporosis screening. Osteoporosis is a condition that causes the bones to get weaker. This can make the bones weak and cause them to break more easily.  Blood pressure screening. Blood pressure changes and medicines to control blood pressure can make you feel dizzy.  Strength and balance checks. Your health care provider  may recommend certain tests to check your strength and balance while standing, walking, or changing positions.  Foot health exam. Foot pain and numbness, as well as not wearing proper footwear, can make you more likely to have a fall.  Depression screening. You may be more likely to have a fall if you have a fear of falling, feel emotionally low, or feel unable to do activities that you used to do.  Alcohol use screening. Using too much alcohol can affect your balance and may make you more likely to have a fall. What  actions can I take to lower my risk of falls? General instructions  Talk with your health care provider about your risks for falling. Tell your health care provider if: ? You fall. Be sure to tell your health care provider about all falls, even ones that seem minor. ? You feel dizzy, sleepy, or off-balance.  Take over-the-counter and prescription medicines only as told by your health care provider. These include any supplements.  Eat a healthy diet and maintain a healthy weight. A healthy diet includes low-fat dairy products, low-fat (lean) meats, and fiber from whole grains, beans, and lots of fruits and vegetables. Home safety  Remove any tripping hazards, such as rugs, cords, and clutter.  Install safety equipment such as grab bars in bathrooms and safety rails on stairs.  Keep rooms and walkways well-lit. Activity   Follow a regular exercise program to stay fit. This will help you maintain your balance. Ask your health care provider what types of exercise are appropriate for you.  If you need a cane or walker, use it as recommended by your health care provider.  Wear supportive shoes that have nonskid soles. Lifestyle  Do not drink alcohol if your health care provider tells you not to drink.  If you drink alcohol, limit how much you have: ? 0-1 drink a day for women. ? 0-2 drinks a day for men.  Be aware of how much alcohol is in your drink. In the [place] one drink equals one typical bottle of beer (12 oz), one-half glass of wine (5 oz), or one shot of hard liquor (1 oz).  Do not use any products that contain nicotine or tobacco, such as cigarettes and e-cigarettes. If you need help quitting, ask your health care provider. Summary  Having a healthy lifestyle and getting preventive care can help to protect your health and wellness after age 18.  Screening and testing are the best way to find a health problem early and help you avoid having a fall. Early diagnosis and  treatment give you the best chance for managing medical conditions that are more common for people who are older than age 48.  Falls are a major cause of broken bones and head injuries in people who are older than age 20. Take precautions to prevent a fall at home.  Work with your health care provider to learn what changes you can make to improve your health and wellness and to prevent falls. This information is not intended to replace advice given to you by your health care provider. Make sure you discuss any questions you have with your health care provider. Document Released: 02/07/2017 Document Revised: 07/18/2018 Document Reviewed: 02/07/2017 Elsevier Patient Education  2020 [employer].

## 2019-01-01 ENCOUNTER — Encounter: Payer: Self-pay | Admitting: Family

## 2019-01-01 ENCOUNTER — Ambulatory Visit (INDEPENDENT_AMBULATORY_CARE_PROVIDER_SITE_OTHER): Payer: Medicare Other | Admitting: Family

## 2019-01-01 ENCOUNTER — Other Ambulatory Visit: Payer: Self-pay

## 2019-01-01 VITALS — BP 135/72 | HR 96 | Temp 97.3°F | Resp 16 | Ht 61.0 in | Wt 129.4 lb

## 2019-01-01 DIAGNOSIS — E039 Hypothyroidism, unspecified: Secondary | ICD-10-CM

## 2019-01-01 DIAGNOSIS — E559 Vitamin D deficiency, unspecified: Secondary | ICD-10-CM

## 2019-01-01 DIAGNOSIS — Z1239 Encounter for other screening for malignant neoplasm of breast: Secondary | ICD-10-CM | POA: Diagnosis not present

## 2019-01-01 DIAGNOSIS — Z0000 Encounter for general adult medical examination without abnormal findings: Secondary | ICD-10-CM

## 2019-01-01 DIAGNOSIS — I10 Essential (primary) hypertension: Secondary | ICD-10-CM | POA: Diagnosis not present

## 2019-01-01 DIAGNOSIS — F17200 Nicotine dependence, unspecified, uncomplicated: Secondary | ICD-10-CM | POA: Diagnosis not present

## 2019-01-01 LAB — COMPREHENSIVE METABOLIC PANEL
ALT: 49 U/L — ABNORMAL HIGH (ref 0–35)
AST: 71 U/L — ABNORMAL HIGH (ref 0–37)
Albumin: 4.9 g/dL (ref 3.5–5.2)
Alkaline Phosphatase: 67 U/L (ref 39–117)
BUN: 11 mg/dL (ref 6–23)
CO2: 25 mEq/L (ref 19–32)
Calcium: 10.3 mg/dL (ref 8.4–10.5)
Chloride: 99 mEq/L (ref 96–112)
Creatinine, Ser: 0.64 mg/dL (ref 0.40–1.20)
GFR: 90.55 mL/min (ref >60.00)
Glucose, Bld: 125 mg/dL — ABNORMAL HIGH (ref 70–99)
Potassium: 4.2 mEq/L (ref 3.5–5.1)
Sodium: 138 mEq/L (ref 135–145)
Total Bilirubin: 1 mg/dL (ref 0.2–1.2)
Total Protein: 7.4 g/dL (ref 6.0–8.3)

## 2019-01-01 LAB — TSH: TSH: 0.91 u[IU]/mL (ref 0.35–4.50)

## 2019-01-01 MED ORDER — SHINGRIX 50 MCG/0.5ML IM SUSR: INTRAMUSCULAR | 1 refills | Status: DC

## 2019-01-01 MED ORDER — LEVOTHYROXINE SODIUM 100 MCG PO TABS: 100.0000 ug | ORAL_TABLET | Freq: Every day | ORAL | 0 refills | Status: DC

## 2019-01-01 NOTE — Patient Instructions (Signed)
Please schedule a routine eye exam. Try to get some regular walking. Goal is 30 minutes 5 days a week. Work on quitting smoking.

## 2019-01-01 NOTE — Progress Notes (Signed)
Subjective:    Patient ID: Jill Armstrong, female    DOB: 1946/03/26, 73 y.o.   MRN: [id number]  HPI  Patient is a 73 yr old female who presents today for follow up.  HTN- maintained on amlodipine. Denies LE edema BP Readings from Last 3 Encounters:  01/01/19 135/72  12/24/17 140/88  08/07/17 127/66   Hyperlipidemia- reports that she is trying to work on her diet.  Lab Results  Component Value Date   CHOL 210 (H) 08/07/2017   HDL 112.10 08/07/2017   LDLCALC 78 08/07/2017   TRIG 103.0 08/07/2017   CHOLHDL 2 08/07/2017    Hypothyroid- maintained on synthroid.  Lab Results  Component Value Date   TSH 0.77 08/07/2017   Tobacco abuse- still smoking 1- 1.5 pack per day.Patient presents today for complete physical.  Immunizations: due for flu and shingrix. Tetanus up to date Diet: trying to eat healthy Exercise: No formal exercise.  Colonoscopy: Denies cologuard, declines colonoscopy Dexa:  2019 Pap Smear: N/A Mammogram:  Due Dental: up to date Vision: Due     Review of Systems  Constitutional: Negative for unexpected weight change.  HENT: Negative for hearing loss and rhinorrhea.   Eyes: Negative for visual disturbance.  Respiratory: Negative for cough.   Gastrointestinal: Negative for blood in stool, constipation and diarrhea.  Genitourinary: Negative for dysuria, frequency and hematuria.  Musculoskeletal: Negative for arthralgias and myalgias.  Skin: Negative for rash.  Neurological: Negative for headaches.  Hematological: Negative for adenopathy.  Psychiatric/Behavioral:       Denies depression/anxiety       Past Medical History:  Diagnosis Date  . Arthritis    osteoarthritis right hip  . Osteopenia   . Thyroid disease    hypothyroidism  . Tobacco abuse      Social History   Socioeconomic History  . Marital status: Divorced    Spouse name: Not on file  . Number of children: 4  . Years of education: Not on file  . Highest education level:  Not on file  Occupational History  . Not on file  Social Needs  . Financial resource strain: Not on file  . Food insecurity    Worry: Not on file    Inability: Not on file  . Transportation needs    Medical: Not on file    Non-medical: Not on file  Tobacco Use  . Smoking status: Current Every Day Smoker    Packs/day: 1.50    Years: 45.00    Pack years: 67.50    Types: Cigarettes  . Smokeless tobacco: Never Used  Substance and Sexual Activity  . Alcohol use: Yes    Comment: couple of times per week  . Drug use: No  . Sexual activity: Never  Lifestyle  . Physical activity    Days per week: Not on file    Minutes per session: Not on file  . Stress: Not on file  Relationships  . Social Musician on phone: Not on file    Gets together: Not on file    Attends religious service: Not on file    Active member of club or organization: Not on file    Attends meetings of clubs or organizations: Not on file    Relationship status: Not on file  . Intimate partner violence    Fear of current or ex partner: Not on file    Emotionally abused: Not on file    Physically abused: Not on  file    Forced sexual activity: Not on file  Other Topics Concern  . Not on file  Social History Narrative   Caffeine Use:  2-6 cups daily   Regular exercise:  No          Past Surgical History:  Procedure Laterality Date  . TONSILLECTOMY AND ADENOIDECTOMY    . TOTAL HIP ARTHROPLASTY  2007   right hip  . TUBAL LIGATION      Family History  Adopted: Yes  Family history unknown: Yes    Allergies  Allergen Reactions  . Fosamax [Alendronate Sodium] Nausea And Vomiting    Current Outpatient Medications on File Prior to Visit  Medication Sig Dispense Refill  . amLODipine (NORVASC) 5 MG tablet Take 1 tablet (5 mg total) by mouth daily. 90 tablet 0  . Calcium Carbonate-Vitamin D (CALTRATE 600+D) 600-400 MG-UNIT per tablet Take 1 tablet by mouth 2 (two) times daily. Reported on  07/13/2015    . levothyroxine (SYNTHROID) 100 MCG tablet Take 1 tablet (100 mcg total) by mouth daily. 90 tablet 0  . Multiple Vitamins-Minerals (MULTIVITAMIN WITH MINERALS) tablet Take 1 tablet by mouth daily.       No current facility-administered medications on file prior to visit.     BP 135/72 (BP Location: Right Arm, Patient Position: Sitting, Cuff Size: Small)   Pulse 96   Temp (!) 97.3 F (36.3 C) (Temporal)   Resp 16   Ht 5\' 1"  (1.549 m)   Wt 129 lb 6.4 oz (58.7 kg)   SpO2 100%   BMI 24.45 kg/m    Objective:   Physical Exam Constitutional:      Appearance: Normal appearance. She is well-developed.  HENT:     Head: Normocephalic and atraumatic.     Right Ear: Tympanic membrane and ear canal normal.     Left Ear: Tympanic membrane and ear canal normal.  Eyes:     General: No scleral icterus.    Extraocular Movements: Extraocular movements intact.     Pupils: Pupils are equal, round, and reactive to light.  Cardiovascular:     Rate and Rhythm: Normal rate and regular rhythm.     Heart sounds: Normal heart sounds. No murmur.  Pulmonary:     Effort: Pulmonary effort is normal. No respiratory distress.     Breath sounds: Normal breath sounds. No wheezing.  Abdominal:     General: Bowel sounds are normal. There is no distension.     Tenderness: There is no abdominal tenderness.  Skin:    General: Skin is warm and dry.  Neurological:     General: No focal deficit present.     Mental Status: She is alert.     Comments: Mild resting head tremor  Psychiatric:        Behavior: Behavior normal.        Thought Content: Thought content normal.        Judgment: Judgment normal.           Assessment & Plan:  Tobacco abuse- encouraged cessation.  Hypothyroid- clinically stable on synthroid. Obtain follow up tsh.  HTN- bp stable on amlodipine, continue same. Obtain follow up bmet.  Hyperlipidemia- mild- continue low fat diet.  Preventative care- Flu shot today.  Recommended that she obtain Shingrix from pharmacy (per insurance requirement).  Refer for mammogram. Declines colo. Dexa is up to date.

## 2019-01-03 ENCOUNTER — Other Ambulatory Visit (INDEPENDENT_AMBULATORY_CARE_PROVIDER_SITE_OTHER): Payer: Medicare Other

## 2019-01-03 DIAGNOSIS — R7989 Other specified abnormal findings of blood chemistry: Secondary | ICD-10-CM

## 2019-01-03 DIAGNOSIS — R945 Abnormal results of liver function studies: Secondary | ICD-10-CM | POA: Diagnosis not present

## 2019-01-03 LAB — GAMMA GT: GGT: 140 U/L — ABNORMAL HIGH (ref 7–51)

## 2019-01-04 LAB — VITAMIN D 1,25 DIHYDROXY
Vitamin D 1, 25 (OH)2 Total: 34 pg/mL (ref 18–72)
Vitamin D2 1, 25 (OH)2: <8 pg/mL
Vitamin D3 1, 25 (OH)2: 34 pg/mL

## 2019-01-07 ENCOUNTER — Other Ambulatory Visit: Payer: Self-pay

## 2019-01-07 DIAGNOSIS — R748 Abnormal levels of other serum enzymes: Secondary | ICD-10-CM

## 2019-01-09 ENCOUNTER — Ambulatory Visit (HOSPITAL_BASED_OUTPATIENT_CLINIC_OR_DEPARTMENT_OTHER)
Admission: RE | Admit: 2019-01-09 | Discharge: 2019-01-09 | Disposition: A | Discharge status: Home / Self Care | Payer: Medicare Other | Source: Ambulatory Visit | Attending: Family | Admitting: Family

## 2019-01-09 ENCOUNTER — Other Ambulatory Visit: Payer: Self-pay

## 2019-01-09 DIAGNOSIS — Z1239 Encounter for other screening for malignant neoplasm of breast: Secondary | ICD-10-CM

## 2019-01-09 DIAGNOSIS — Z1231 Encounter for screening mammogram for malignant neoplasm of breast: Secondary | ICD-10-CM | POA: Diagnosis not present

## 2019-01-15 ENCOUNTER — Telehealth: Payer: Self-pay | Admitting: Family

## 2019-01-15 NOTE — Telephone Encounter (Signed)
°  Relation to pt: self  Call back number: [phone]    Reason for call:  Patent was seen 01/01/2019 and states as per AVS "medications have change" patient inquiring what medications have change. Patent states pharmacy never mentioned receiving shingles Rx (advised patient to check with pharmacy). Patient would like to know if the shingles shot will interfere with the current medication she's on, please advise

## 2019-01-16 NOTE — Telephone Encounter (Signed)
Shingrix will not interfere with her other medications.

## 2019-01-16 NOTE — Telephone Encounter (Signed)
Patient advised there was no change, a new rx for shingrix was sent to her pharmacy this will come up on her summary as something new. Also advised it will not interfere with her current meds.

## 2019-02-08 ENCOUNTER — Other Ambulatory Visit: Payer: Self-pay | Admitting: Family

## 2019-02-10 ENCOUNTER — Other Ambulatory Visit: Payer: Medicare Other

## 2019-03-17 ENCOUNTER — Telehealth: Payer: Self-pay | Admitting: Family

## 2019-03-17 DIAGNOSIS — R748 Abnormal levels of other serum enzymes: Secondary | ICD-10-CM

## 2019-03-17 NOTE — Telephone Encounter (Signed)
Please contact pt to schedule a follow up lab appointment to recheck her liver function tests.

## 2019-03-18 ENCOUNTER — Other Ambulatory Visit: Payer: Medicare Other

## 2019-03-18 NOTE — Telephone Encounter (Signed)
Per patient she had an appointment for this and called to cancel it. "have family visiting until the end of the year and doesn't want to come in for labs at this time". I offered her an appointment for  January and she declined. "she will call us to schedule when she is available to get this done"

## 2019-04-25 ENCOUNTER — Other Ambulatory Visit: Payer: Self-pay | Admitting: Family

## 2019-04-25 MED ORDER — LEVOTHYROXINE SODIUM 100 MCG PO TABS: 100.0000 ug | ORAL_TABLET | Freq: Every day | ORAL | 0 refills | Status: DC

## 2019-04-25 NOTE — Telephone Encounter (Signed)
Requested Prescriptions  Pending Prescriptions Disp Refills  . levothyroxine (SYNTHROID) 100 MCG tablet 90 tablet 0    Sig: Take 1 tablet (100 mcg total) by mouth daily.     Endocrinology:  Hypothyroid Agents Failed - 04/25/2019 11:23 AM      Failed - TSH needs to be rechecked within 3 months after an abnormal result. Refill until TSH is due.      Passed - TSH in normal range and within 360 days    TSH  Date Value Ref Range Status  01/01/2019 0.91 0.35 - 4.50 uIU/mL Final         Passed - Valid encounter within last 12 months    Recent Outpatient Visits          3 months ago Preventative health care   Holiday representative at [facility] [place], [place], NP   1 year ago Essential hypertension   Holiday representative at [facility] [place], [place], NP   2 years ago Hyperlipidemia, unspecified hyperlipidemia type   Holiday representative at [employer], [place], NP   2 years ago Essential hypertension   Holiday representative at [employer], [place], NP   3 years ago Vitamin D deficiency   Holiday representative at [employer], Efraim Kaufmann, NP      Future Appointments            In 2 months Sandford Craze, NP [employer] at [facility], [facility]

## 2019-04-25 NOTE — Telephone Encounter (Signed)
Medication Refill - Medication: levothyroxine (SYNTHROID) 100 MCG tablet [[id number]]    Preferred Pharmacy (with phone number or street name):  [id number] [place], [place] - [address] AT [facility] [address]  [address] [place] [address]  Phone: [phone] Fax: [phone]     Agent: Please be advised that RX refills may take up to 3 business days. We ask that you follow-up with your pharmacy.

## 2019-04-29 ENCOUNTER — Telehealth: Payer: Self-pay | Admitting: Family

## 2019-04-29 MED ORDER — LEVOTHYROXINE SODIUM 100 MCG PO TABS: 100.0000 ug | ORAL_TABLET | Freq: Every day | ORAL | 1 refills | Status: DC

## 2019-04-29 NOTE — Telephone Encounter (Signed)
Medication Refill - Medication: levothyroxine (SYNTHROID) 100 MCG tablet    Has the patient contacted their pharmacy? Yes.   (Agent: If no, request that the patient contact the pharmacy for the refill.) (Agent: If yes, when and what did the pharmacy advise?)  Preferred Pharmacy (with phone number or street name): [facility]  [address]  Phone [phone]  Pt only has 2 pills left   rx was sent to the wrong pharmacy, pt is asking for it to be sent to correct pharmacy  Please call pt when this is done    Agent: Please be advised that RX refills may take up to 3 business days. We ask that you follow-up with your pharmacy.

## 2019-04-29 NOTE — Telephone Encounter (Signed)
Medication has been refilled.

## 2019-05-30 ENCOUNTER — Other Ambulatory Visit: Payer: Self-pay | Admitting: Family

## 2019-06-26 ENCOUNTER — Other Ambulatory Visit: Payer: Self-pay

## 2019-07-01 ENCOUNTER — Ambulatory Visit: Payer: Medicare Other | Admitting: Family

## 2019-08-05 ENCOUNTER — Ambulatory Visit: Payer: Medicare Other | Admitting: Family

## 2019-08-06 ENCOUNTER — Encounter: Payer: Self-pay | Admitting: Family

## 2019-08-06 ENCOUNTER — Ambulatory Visit (INDEPENDENT_AMBULATORY_CARE_PROVIDER_SITE_OTHER): Payer: Medicare Other | Admitting: Family

## 2019-08-06 ENCOUNTER — Other Ambulatory Visit: Payer: Self-pay

## 2019-08-06 VITALS — BP 115/64 | HR 81 | Temp 97.9°F | Resp 16 | Ht 61.0 in | Wt 113.8 lb

## 2019-08-06 DIAGNOSIS — Z720 Tobacco use: Secondary | ICD-10-CM

## 2019-08-06 DIAGNOSIS — E039 Hypothyroidism, unspecified: Secondary | ICD-10-CM

## 2019-08-06 DIAGNOSIS — R634 Abnormal weight loss: Secondary | ICD-10-CM

## 2019-08-06 DIAGNOSIS — I10 Essential (primary) hypertension: Secondary | ICD-10-CM

## 2019-08-06 LAB — COMPREHENSIVE METABOLIC PANEL
ALT: 9 U/L (ref 0–35)
AST: 13 U/L (ref 0–37)
Albumin: 4.3 g/dL (ref 3.5–5.2)
Alkaline Phosphatase: 62 U/L (ref 39–117)
BUN: 11 mg/dL (ref 6–23)
CO2: 27 mEq/L (ref 19–32)
Calcium: 10 mg/dL (ref 8.4–10.5)
Chloride: 103 mEq/L (ref 96–112)
Creatinine, Ser: 0.77 mg/dL (ref 0.40–1.20)
GFR: 73.03 mL/min (ref >60.00)
Glucose, Bld: 100 mg/dL — ABNORMAL HIGH (ref 70–99)
Potassium: 4.9 mEq/L (ref 3.5–5.1)
Sodium: 138 mEq/L (ref 135–145)
Total Bilirubin: 0.9 mg/dL (ref 0.2–1.2)
Total Protein: 6.8 g/dL (ref 6.0–8.3)

## 2019-08-06 LAB — TSH: TSH: 0.02 u[IU]/mL — ABNORMAL LOW (ref 0.35–4.50)

## 2019-08-06 NOTE — Progress Notes (Signed)
Subjective:    Patient ID: Jill Armstrong, female    DOB: 06/23/1945, 74 y.o.   MRN: [id number]  HPI  Patient is a 74 yr old female who presents today for follow up.  HTN- maintained on amlodipine 5mg .  Denies LE edema.  BP Readings from Last 3 Encounters:  08/06/19 115/64  01/01/19 135/72  12/24/17 140/88   Hypothyroid- maintained on synthroid 12/26/17. Reports energy is good. Reports that her diet is not significantly different. Maybe a little bit more active.   Wt Readings from Last 3 Encounters:  08/06/19 113 lb 12.8 oz (51.6 kg)  01/01/19 129 lb 6.4 oz (58.7 kg)  12/30/18 124 lb (56.2 kg)   Tobacco abuse- reports that she is smoking <1 PPD.    Lab Results  Component Value Date   TSH 0.91 01/01/2019      Review of Systems See HPI  Past Medical History:  Diagnosis Date  . Arthritis    osteoarthritis right hip  . Osteopenia   . Thyroid disease    hypothyroidism  . Tobacco abuse      Social History   Socioeconomic History  . Marital status: Divorced    Spouse name: Not on file  . Number of children: 4  . Years of education: Not on file  . Highest education level: Not on file  Occupational History  . Not on file  Tobacco Use  . Smoking status: Current Every Day Smoker    Packs/day: 1.50    Years: 45.00    Pack years: 67.50    Types: Cigarettes  . Smokeless tobacco: Never Used  Substance and Sexual Activity  . Alcohol use: Yes    Comment: couple of times per week  . Drug use: No  . Sexual activity: Never  Other Topics Concern  . Not on file  Social History Narrative   Caffeine Use:  2-6 cups daily   Regular exercise:  No   4 children, one in [place], 1 in [place], 1 in [place], 1 in [place]   1 grandchildren   Retired from department store.    Social Determinants of Health   Financial Resource Strain:   . Difficulty of Paying Living Expenses:   Food Insecurity:   . Worried About [place] in the Last Year:   . Programme researcher, broadcasting/film/video in the Last Year:   Transportation Needs:   . Engineer, site (Medical):   Freight forwarder Lack of Transportation (Non-Medical):   Physical Activity:   . Days of Exercise per Week:   . Minutes of Exercise per Session:   Stress:   . Feeling of Stress :   Social Connections:   . Frequency of Communication with Friends and Family:   . Frequency of Social Gatherings with Friends and Family:   . Attends Religious Services:   . Active Member of Clubs or Organizations:   . Attends Marland Kitchen Meetings:   Banker Marital Status:   Intimate Partner Violence:   . Fear of Current or Ex-Partner:   . Emotionally Abused:   Marland Kitchen Physically Abused:   . Sexually Abused:     Past Surgical History:  Procedure Laterality Date  . TONSILLECTOMY AND ADENOIDECTOMY    . TOTAL HIP ARTHROPLASTY  2007   right hip  . TUBAL LIGATION      Family History  Adopted: Yes  Family history unknown: Yes    Allergies  Allergen Reactions  . Fosamax [Alendronate Sodium] Nausea  And Vomiting    Current Outpatient Medications on File Prior to Visit  Medication Sig Dispense Refill  . amLODipine (NORVASC) 5 MG tablet Take 1 tablet by mouth once daily 90 tablet 0  . Calcium Carbonate-Vitamin D (CALTRATE 600+D) 600-400 MG-UNIT per tablet Take 1 tablet by mouth 2 (two) times daily. Reported on 07/13/2015    . levothyroxine (SYNTHROID) 100 MCG tablet Take 1 tablet (100 mcg total) by mouth daily. 90 tablet 1  . Multiple Vitamins-Minerals (MULTIVITAMIN WITH MINERALS) tablet Take 1 tablet by mouth daily.      Marland Kitchen Zoster Vaccine Adjuvanted [facility]) injection Inject 0.5mg  IM now and again in 2-6 months. 0.5 mL 1   No current facility-administered medications on file prior to visit.    BP 115/64 (BP Location: Left Arm, Patient Position: Sitting, Cuff Size: Small)   Pulse 81   Temp 97.9 F (36.6 C) (Temporal)   Resp 16   Ht 5\' 1"  (1.549 m)   Wt 113 lb 12.8 oz (51.6 kg)   SpO2 100%   BMI 21.50 kg/m         Objective:   Physical Exam Constitutional:      Appearance: She is well-developed.  Neck:     Thyroid: No thyromegaly.  Cardiovascular:     Rate and Rhythm: Normal rate and regular rhythm.     Heart sounds: Normal heart sounds. No murmur.  Pulmonary:     Effort: Pulmonary effort is normal. No respiratory distress.     Breath sounds: Normal breath sounds. No wheezing.  Abdominal:     General: Bowel sounds are normal. There is no distension.     Palpations: Abdomen is soft.     Tenderness: There is no abdominal tenderness.  Musculoskeletal:     Cervical back: Neck supple.  Skin:    General: Skin is warm and dry.  Neurological:     Mental Status: She is alert and oriented to person, place, and time.  Psychiatric:        Behavior: Behavior normal.        Thought Content: Thought content normal.        Judgment: Judgment normal.           Assessment & Plan:  HTN- bp stable on current dose of amlodipine. Continue same.  Hypothyroid- obtain follow up TSH. Continue synthroid.  Weight loss- I am concerned about this. Will check CT chest as below, CMET, TSH, CBC. Encouraged pt to make sure she is eating a proper diet. Follow up in 1 month. If above work up is unrevealing and she has further weight loss, then we will need to consider further work up for possible underlying malignancy.  Hx of tobacco abuse- obtain screening CT chest.  This visit occurred during the SARS-CoV-2 public health emergency.  Safety protocols were in place, including screening questions prior to the visit, additional usage of staff PPE, and extensive cleaning of exam room while observing appropriate contact time as indicated for disinfecting solutions.

## 2019-08-06 NOTE — Patient Instructions (Signed)
Please complete lab work prior to leaving.   

## 2019-08-07 ENCOUNTER — Telehealth: Payer: Self-pay | Admitting: Family

## 2019-08-07 LAB — CBC WITH DIFFERENTIAL/PLATELET
Basophils Absolute: 0.1 10*3/uL (ref 0.0–0.1)
Basophils Relative: 1.2 % (ref 0.0–3.0)
Eosinophils Absolute: 0.1 10*3/uL (ref 0.0–0.7)
Eosinophils Relative: 2.2 % (ref 0.0–5.0)
HCT: 44.7 % (ref 36.0–46.0)
Hemoglobin: 14.9 g/dL (ref 12.0–15.0)
Lymphocytes Relative: 31.8 % (ref 12.0–46.0)
Lymphs Abs: 2.2 10*3/uL (ref 0.7–4.0)
MCHC: 33.4 g/dL (ref 30.0–36.0)
MCV: 100.4 fl — ABNORMAL HIGH (ref 78.0–100.0)
Monocytes Absolute: 0.5 10*3/uL (ref 0.1–1.0)
Monocytes Relative: 7.5 % (ref 3.0–12.0)
Neutro Abs: 3.9 10*3/uL (ref 1.4–7.7)
Neutrophils Relative %: 57.3 % (ref 43.0–77.0)
Platelets: 252 10*3/uL (ref 150.0–400.0)
RBC: 4.45 Mil/uL (ref 3.87–5.11)
RDW: 13.2 % (ref 11.5–15.5)
WBC: 6.8 10*3/uL (ref 4.0–10.5)

## 2019-08-07 MED ORDER — LEVOTHYROXINE SODIUM 88 MCG PO TABS
88.0000 ug | ORAL_TABLET | Freq: Every day | ORAL | 3 refills | Status: DC
Start: 2019-08-07 — End: 2020-01-09

## 2019-08-07 NOTE — Telephone Encounter (Signed)
Please contact pt and let her know that her thyroid test shows we need to decrease her synthroid dose.  I would like her to decrease to 88 mcg once daily. Follow up with me in 1 month for face to face visit and repeat lab wor.

## 2019-08-08 NOTE — Telephone Encounter (Signed)
Patient advised of results and medication change.

## 2019-08-13 DIAGNOSIS — H527 Unspecified disorder of refraction: Secondary | ICD-10-CM | POA: Diagnosis not present

## 2019-08-13 DIAGNOSIS — H43812 Vitreous degeneration, left eye: Secondary | ICD-10-CM | POA: Diagnosis not present

## 2019-08-13 DIAGNOSIS — H2513 Age-related nuclear cataract, bilateral: Secondary | ICD-10-CM | POA: Diagnosis not present

## 2019-08-13 DIAGNOSIS — H35411 Lattice degeneration of retina, right eye: Secondary | ICD-10-CM | POA: Diagnosis not present

## 2019-08-21 ENCOUNTER — Ambulatory Visit (HOSPITAL_BASED_OUTPATIENT_CLINIC_OR_DEPARTMENT_OTHER): Payer: Medicare Other

## 2019-09-15 ENCOUNTER — Other Ambulatory Visit: Payer: Self-pay | Admitting: Family

## 2019-10-15 DIAGNOSIS — H2511 Age-related nuclear cataract, right eye: Secondary | ICD-10-CM | POA: Diagnosis not present

## 2019-10-15 DIAGNOSIS — Z79899 Other long term (current) drug therapy: Secondary | ICD-10-CM | POA: Diagnosis not present

## 2019-10-15 DIAGNOSIS — E039 Hypothyroidism, unspecified: Secondary | ICD-10-CM | POA: Diagnosis not present

## 2019-10-15 DIAGNOSIS — I10 Essential (primary) hypertension: Secondary | ICD-10-CM | POA: Diagnosis not present

## 2019-10-15 DIAGNOSIS — M1990 Unspecified osteoarthritis, unspecified site: Secondary | ICD-10-CM | POA: Diagnosis not present

## 2019-10-21 DIAGNOSIS — H2512 Age-related nuclear cataract, left eye: Secondary | ICD-10-CM | POA: Diagnosis not present

## 2019-10-29 DIAGNOSIS — E039 Hypothyroidism, unspecified: Secondary | ICD-10-CM | POA: Diagnosis not present

## 2019-10-29 DIAGNOSIS — F17210 Nicotine dependence, cigarettes, uncomplicated: Secondary | ICD-10-CM | POA: Diagnosis not present

## 2019-10-29 DIAGNOSIS — H2512 Age-related nuclear cataract, left eye: Secondary | ICD-10-CM | POA: Diagnosis not present

## 2019-10-29 DIAGNOSIS — I10 Essential (primary) hypertension: Secondary | ICD-10-CM | POA: Diagnosis not present

## 2019-10-29 DIAGNOSIS — M1990 Unspecified osteoarthritis, unspecified site: Secondary | ICD-10-CM | POA: Diagnosis not present

## 2019-10-29 DIAGNOSIS — Z79899 Other long term (current) drug therapy: Secondary | ICD-10-CM | POA: Diagnosis not present

## 2020-01-09 ENCOUNTER — Other Ambulatory Visit: Payer: Self-pay | Admitting: Family

## 2020-02-03 ENCOUNTER — Other Ambulatory Visit: Payer: Self-pay

## 2020-02-03 ENCOUNTER — Other Ambulatory Visit: Payer: Self-pay | Admitting: Family

## 2020-02-03 MED ORDER — AMLODIPINE BESYLATE 5 MG PO TABS
5.0000 mg | ORAL_TABLET | Freq: Every day | ORAL | 0 refills | Status: DC
Start: 2020-02-03 — End: 2020-07-21

## 2020-02-03 MED ORDER — LEVOTHYROXINE SODIUM 88 MCG PO TABS: 88.0000 ug | ORAL_TABLET | Freq: Every day | ORAL | 0 refills | Status: DC

## 2020-02-03 NOTE — Telephone Encounter (Signed)
Patient is checking the status of medication 

## 2020-02-11 ENCOUNTER — Other Ambulatory Visit: Payer: Self-pay

## 2020-02-11 ENCOUNTER — Ambulatory Visit (INDEPENDENT_AMBULATORY_CARE_PROVIDER_SITE_OTHER): Payer: Medicare Other | Admitting: Family

## 2020-02-11 VITALS — BP 124/67 | HR 96 | Temp 98.7°F | Resp 16 | Ht 60.0 in | Wt 115.0 lb

## 2020-02-11 DIAGNOSIS — E559 Vitamin D deficiency, unspecified: Secondary | ICD-10-CM | POA: Diagnosis not present

## 2020-02-11 DIAGNOSIS — Z23 Encounter for immunization: Secondary | ICD-10-CM

## 2020-02-11 DIAGNOSIS — E1169 Type 2 diabetes mellitus with other specified complication: Secondary | ICD-10-CM | POA: Diagnosis not present

## 2020-02-11 DIAGNOSIS — I10 Essential (primary) hypertension: Secondary | ICD-10-CM

## 2020-02-11 DIAGNOSIS — R634 Abnormal weight loss: Secondary | ICD-10-CM

## 2020-02-11 DIAGNOSIS — E039 Hypothyroidism, unspecified: Secondary | ICD-10-CM | POA: Diagnosis not present

## 2020-02-11 DIAGNOSIS — F17200 Nicotine dependence, unspecified, uncomplicated: Secondary | ICD-10-CM

## 2020-02-11 DIAGNOSIS — E785 Hyperlipidemia, unspecified: Secondary | ICD-10-CM

## 2020-02-11 NOTE — Patient Instructions (Signed)
Please complete lab work prior to leaving. Please work on quitting smoking. Please get your covid Booster shot.

## 2020-02-11 NOTE — Progress Notes (Signed)
Subjective:    Patient ID: Jill Armstrong, female    DOB: [birth year], 74 y.o.   MRN: [id number]  HPI   Patient is a 73 yr old female who presents today for follow up.  HTN- on amlodipine 5mg .  BP Readings from Last 3 Encounters:  02/11/20 124/67  08/06/19 115/64  01/01/19 135/72   Vit D deficiency- has not been taking caltrate recently.   Hypothyroid- last visit TSH was low. We reduced her synthroid dose. Jill Armstrong did not follow up for blood work until now.   Lab Results  Component Value Date   TSH 0.02 (L) 08/06/2019    Tobacco abuse- has not tried to quit.  Weight loss-  Wt Readings from Last 3 Encounters:  02/11/20 115 lb (52.2 kg)  08/06/19 113 lb 12.8 oz (51.6 kg)  01/01/19 129 lb 6.4 oz (58.7 kg)     Review of Systems    see HPI  Past Medical History:  Diagnosis Date  . Arthritis    osteoarthritis right hip  . Osteopenia   . Thyroid disease    hypothyroidism  . Tobacco abuse      Social History   Socioeconomic History  . Marital status: Divorced    Spouse name: Not on file  . Number of children: 4  . Years of education: Not on file  . Highest education level: Not on file  Occupational History  . Not on file  Tobacco Use  . Smoking status: Current Every Day Smoker    Packs/day: 1.50    Years: 45.00    Pack years: 67.50    Types: Cigarettes  . Smokeless tobacco: Never Used  Substance and Sexual Activity  . Alcohol use: Yes    Comment: couple of times per week  . Drug use: No  . Sexual activity: Never  Other Topics Concern  . Not on file  Social History Narrative   Caffeine Use:  2-6 cups daily   Regular exercise:  No   4 children, one in [place], 1 in [place], 1 in [place], 1 in [place]   1 grandchildren   Retired from department store.    Social Determinants of Health   Financial Resource Strain:   . Difficulty of Paying Living Expenses: Not on file  Food Insecurity:   . Worried About [place] in the Last Year: Not on  file  . Ran Out of Food in the Last Year: Not on file  Transportation Needs:   . Lack of Transportation (Medical): Not on file  . Lack of Transportation (Non-Medical): Not on file  Physical Activity:   . Days of Exercise per Week: Not on file  . Minutes of Exercise per Session: Not on file  Stress:   . Feeling of Stress : Not on file  Social Connections:   . Frequency of Communication with Friends and Family: Not on file  . Frequency of Social Gatherings with Friends and Family: Not on file  . Attends Religious Services: Not on file  . Active Member of Clubs or Organizations: Not on file  . Attends Programme researcher, broadcasting/film/video Meetings: Not on file  . Marital Status: Not on file  Intimate Partner Violence:   . Fear of Current or Ex-Partner: Not on file  . Emotionally Abused: Not on file  . Physically Abused: Not on file  . Sexually Abused: Not on file    Past Surgical History:  Procedure Laterality Date  . TONSILLECTOMY AND ADENOIDECTOMY    .  TOTAL HIP ARTHROPLASTY  2007   right hip  . TUBAL LIGATION      Family History  Adopted: Yes  Family history unknown: Yes    Allergies  Allergen Reactions  . Fosamax [Alendronate Sodium] Nausea And Vomiting    Current Outpatient Medications on File Prior to Visit  Medication Sig Dispense Refill  . amLODipine (NORVASC) 5 MG tablet Take 1 tablet by mouth once daily 90 tablet 0  . amLODipine (NORVASC) 5 MG tablet Take 1 tablet (5 mg total) by mouth daily. 90 tablet 0  . Calcium Carbonate-Vitamin D (CALTRATE 600+D) 600-400 MG-UNIT per tablet Take 1 tablet by mouth 2 (two) times daily. Reported on 07/13/2015    . EUTHYROX 88 MCG tablet TAKE 1 TABLET BY MOUTH ONCE DAILY BEFORE BREAKFAST (OVERDUE  FOR  VISIT) 30 tablet 0  . levothyroxine (EUTHYROX) 88 MCG tablet Take 1 tablet (88 mcg total) by mouth daily before breakfast. 30 tablet 0  . Multiple Vitamins-Minerals (MULTIVITAMIN WITH MINERALS) tablet Take 1 tablet by mouth daily.       No  current facility-administered medications on file prior to visit.    BP 124/67 (BP Location: Right Arm, Patient Position: Sitting, Cuff Size: Small)   Pulse 96   Temp 98.7 F (37.1 C) (Oral)   Resp 16   Ht 5' (1.524 m)   Wt 115 lb (52.2 kg)   SpO2 98%   BMI 22.46 kg/m    Objective:   Physical Exam Constitutional:      Appearance: Jill Armstrong is well-developed.  Cardiovascular:     Rate and Rhythm: Normal rate and regular rhythm.     Heart sounds: Normal heart sounds. No murmur heard.   Pulmonary:     Effort: Pulmonary effort is normal. No respiratory distress.     Breath sounds: Normal breath sounds. No wheezing.  Psychiatric:        Behavior: Behavior normal.        Thought Content: Thought content normal.        Judgment: Judgment normal.           Assessment & Plan:  HTN- bp stable. Continue amlodipine 5mg .  Weight loss- weight has stabilized.  Monitor.  Hypothyroid- obtain follow up TSH. Continue synthroid.  Tobacco abuse- pt counseled on the importance of quitting smoking.  Vitamin D deficiency- obtain follow up vit D level. Resume caltrate.  Hyperlipidemia- obtain follow up lipid panel.  This visit occurred during the SARS-CoV-2 public health emergency.  Safety protocols were in place, including screening questions prior to the visit, additional usage of staff PPE, and extensive cleaning of exam room while observing appropriate contact time as indicated for disinfecting solutions.

## 2020-02-14 LAB — TSH: TSH: 1.53 mIU/L (ref 0.40–4.50)

## 2020-02-14 LAB — LIPID PANEL
Cholesterol: 237 mg/dL — ABNORMAL HIGH (ref <200)
HDL: 117 mg/dL (ref >=50)
LDL Cholesterol (Calc): 102 mg/dL (calc) — ABNORMAL HIGH
Non-HDL Cholesterol (Calc): 120 mg/dL (calc) (ref <130)
Total CHOL/HDL Ratio: 2 (calc) (ref <5.0)
Triglycerides: 89 mg/dL (ref <150)

## 2020-02-14 LAB — BASIC METABOLIC PANEL
BUN: 13 mg/dL (ref 7–25)
CO2: 20 mmol/L (ref 20–32)
Calcium: 9.9 mg/dL (ref 8.6–10.4)
Chloride: 104 mmol/L (ref 98–110)
Creat: 0.78 mg/dL (ref 0.60–0.93)
Glucose, Bld: 99 mg/dL (ref 65–99)
Potassium: 4.1 mmol/L (ref 3.5–5.3)
Sodium: 141 mmol/L (ref 135–146)

## 2020-02-14 LAB — VITAMIN D 1,25 DIHYDROXY
Vitamin D 1, 25 (OH)2 Total: 35 pg/mL (ref 18–72)
Vitamin D2 1, 25 (OH)2: <8 pg/mL
Vitamin D3 1, 25 (OH)2: 35 pg/mL

## 2020-02-16 ENCOUNTER — Encounter: Payer: Self-pay | Admitting: Family

## 2020-02-16 NOTE — Progress Notes (Signed)
Mailed out to patient 

## 2020-04-01 ENCOUNTER — Other Ambulatory Visit: Payer: Self-pay | Admitting: *Deleted

## 2020-04-01 MED ORDER — LEVOTHYROXINE SODIUM 88 MCG PO TABS
ORAL_TABLET | ORAL | 1 refills | Status: DC
Start: 2020-04-01 — End: 2020-08-10

## 2020-07-21 ENCOUNTER — Other Ambulatory Visit: Payer: Self-pay | Admitting: Family

## 2020-08-10 ENCOUNTER — Other Ambulatory Visit: Payer: Self-pay

## 2020-08-10 ENCOUNTER — Encounter: Payer: Self-pay | Admitting: Family

## 2020-08-10 ENCOUNTER — Ambulatory Visit (INDEPENDENT_AMBULATORY_CARE_PROVIDER_SITE_OTHER): Payer: Medicare Other | Admitting: Family

## 2020-08-10 ENCOUNTER — Telehealth: Payer: Self-pay

## 2020-08-10 VITALS — BP 114/72 | HR 104 | Temp 98.9°F | Resp 16 | Ht 60.0 in | Wt 115.8 lb

## 2020-08-10 DIAGNOSIS — M810 Age-related osteoporosis without current pathological fracture: Secondary | ICD-10-CM | POA: Diagnosis not present

## 2020-08-10 DIAGNOSIS — E039 Hypothyroidism, unspecified: Secondary | ICD-10-CM | POA: Diagnosis not present

## 2020-08-10 DIAGNOSIS — I10 Essential (primary) hypertension: Secondary | ICD-10-CM

## 2020-08-10 DIAGNOSIS — R739 Hyperglycemia, unspecified: Secondary | ICD-10-CM

## 2020-08-10 MED ORDER — AMLODIPINE BESYLATE 5 MG PO TABS: 1.0000 | ORAL_TABLET | Freq: Every day | ORAL | 0 refills | Status: DC

## 2020-08-10 MED ORDER — LEVOTHYROXINE SODIUM 88 MCG PO TABS: ORAL_TABLET | ORAL | 1 refills | Status: DC

## 2020-08-10 NOTE — Assessment & Plan Note (Signed)
Obtain follow up bone density.

## 2020-08-10 NOTE — Progress Notes (Signed)
Established Patient Office Visit  Subjective:  Patient ID: Jill Armstrong, female    DOB: 1946-04-01  Age: 75 y.o. MRN: [id number]  CC:  Chief Complaint  Patient presents with  . Hypertension    Here for follow up   . Hypothyroidism    Here for follow up    HPI Jill Armstrong presents for follow up.  HTN- pt is maintained on amlodipine 5mg . She denies LE edema.   BP Readings from Last 3 Encounters:  08/10/20 114/72  02/11/20 124/67  08/06/19 115/64   Hypothyroid- maintained on levothyroxine 88 mcg.  Reports feeling well on this dose.  Wt Readings from Last 3 Encounters:  08/10/20 115 lb 12.8 oz (52.5 kg)  02/11/20 115 lb (52.2 kg)  08/06/19 113 lb 12.8 oz (51.6 kg)    Hypothyroid- maintained on synthroid 88 mcg. Feeling well on this dose.   Lab Results  Component Value Date   TSH 1.53 02/11/2020      Past Medical History:  Diagnosis Date  . Arthritis    osteoarthritis right hip  . Osteopenia   . Thyroid disease    hypothyroidism  . Tobacco abuse     Past Surgical History:  Procedure Laterality Date  . TONSILLECTOMY AND ADENOIDECTOMY    . TOTAL HIP ARTHROPLASTY  2007   right hip  . TUBAL LIGATION      Family History  Adopted: Yes  Family history unknown: Yes    Social History   Socioeconomic History  . Marital status: Divorced    Spouse name: Not on file  . Number of children: 4  . Years of education: Not on file  . Highest education level: Not on file  Occupational History  . Not on file  Tobacco Use  . Smoking status: Current Every Day Smoker    Packs/day: 1.50    Years: 45.00    Pack years: 67.50    Types: Cigarettes  . Smokeless tobacco: Never Used  Substance and Sexual Activity  . Alcohol use: Yes    Comment: couple of times per week  . Drug use: No  . Sexual activity: Never  Other Topics Concern  . Not on file  Social History Narrative   Caffeine Use:  2-6 cups daily   Regular exercise:  No   4 children, one in [place],  1 in [place], 1 in [place], 1 in [place]   1 grandchildren   Retired from department store.    Social Determinants of Health   Financial Resource Strain: Not on file  Food Insecurity: Not on file  Transportation Needs: Not on file  Physical Activity: Not on file  Stress: Not on file  Social Connections: Not on file  Intimate Partner Violence: Not on file    Outpatient Medications Prior to Visit  Medication Sig Dispense Refill  . amLODipine (NORVASC) 5 MG tablet Take 1 tablet by mouth once daily 90 tablet 0  . Calcium Carbonate-Vitamin D 600-400 MG-UNIT tablet Take 1 tablet by mouth 2 (two) times daily. Reported on 07/13/2015    . levothyroxine (EUTHYROX) 88 MCG tablet TAKE 1 TABLET BY MOUTH ONCE DAILY BEFORE BREAKFAST 90 tablet 1  . Multiple Vitamins-Minerals (MULTIVITAMIN WITH MINERALS) tablet Take 1 tablet by mouth daily.    09/12/2015 amLODipine (NORVASC) 5 MG tablet Take 1 tablet by mouth once daily 90 tablet 0   No facility-administered medications prior to visit.    Allergies  Allergen Reactions  . Fosamax [Alendronate Sodium] Nausea  And Vomiting    ROS Review of Systems    Objective:    Physical Exam Constitutional:      Appearance: She is well-developed.  Cardiovascular:     Rate and Rhythm: Normal rate and regular rhythm.     Heart sounds: Normal heart sounds. No murmur heard.   Pulmonary:     Effort: Pulmonary effort is normal. No respiratory distress.     Breath sounds: Normal breath sounds. No wheezing.  Psychiatric:        Behavior: Behavior normal.        Thought Content: Thought content normal.        Judgment: Judgment normal.     BP 114/72 (BP Location: Right Arm, Patient Position: Sitting, Cuff Size: Small)   Pulse (!) 104   Temp 98.9 F (37.2 C) (Oral)   Resp 16   Ht 5' (1.524 m)   Wt 115 lb 12.8 oz (52.5 kg)   SpO2 97%   BMI 22.62 kg/m  Wt Readings from Last 3 Encounters:  08/10/20 115 lb 12.8 oz (52.5 kg)  02/11/20 115 lb (52.2 kg)   08/06/19 113 lb 12.8 oz (51.6 kg)     Health Maintenance Due  Topic Date Due  . Hepatitis C Screening  Never done  . URINE MICROALBUMIN  09/10/2013  . COVID-19 Vaccine (3 - Booster for Moderna series) 12/12/2019    There are no preventive care reminders to display for this patient.  Lab Results  Component Value Date   TSH 1.53 02/11/2020   Lab Results  Component Value Date   WBC 6.8 08/06/2019   HGB 14.9 08/06/2019   HCT 44.7 08/06/2019   MCV 100.4 (H) 08/06/2019   PLT 252.0 08/06/2019   Lab Results  Component Value Date   NA 141 02/11/2020   K 4.1 02/11/2020   CO2 20 02/11/2020   GLUCOSE 99 02/11/2020   BUN 13 02/11/2020   CREATININE 0.78 02/11/2020   BILITOT 0.9 08/06/2019   ALKPHOS 62 08/06/2019   AST 13 08/06/2019   ALT 9 08/06/2019   PROT 6.8 08/06/2019   ALBUMIN 4.3 08/06/2019   CALCIUM 9.9 02/11/2020   GFR 73.03 08/06/2019   Lab Results  Component Value Date   CHOL 237 (H) 02/11/2020   Lab Results  Component Value Date   HDL 117 02/11/2020   Lab Results  Component Value Date   LDLCALC 102 (H) 02/11/2020   Lab Results  Component Value Date   TRIG 89 02/11/2020   Lab Results  Component Value Date   CHOLHDL 2.0 02/11/2020   Lab Results  Component Value Date   HGBA1C 5.4 11/15/2016      Assessment & Plan:   Problem List Items Addressed This Visit   None     No orders of the defined types were placed in this encounter.   Follow-up: No follow-ups on file.    Lemont Fillers, NP

## 2020-08-10 NOTE — Assessment & Plan Note (Signed)
BP stable. Continue amlodipine 5mg once daily.  

## 2020-08-10 NOTE — Assessment & Plan Note (Signed)
Clinically stable on synthroid 88 mcg once daily. Obtain follow up tsh.

## 2020-08-10 NOTE — Patient Instructions (Signed)
Please complete lab work prior to leaving.   

## 2020-08-10 NOTE — Assessment & Plan Note (Signed)
Obtain follow up A1C.   

## 2020-08-10 NOTE — Telephone Encounter (Signed)
Patient left office without labs called and left voicemail for patient to return call to office.

## 2020-08-11 NOTE — Telephone Encounter (Signed)
Orders faxed to [facility] at fax number provided. Patient notified, she will contact them tomorrow for appointment

## 2020-08-11 NOTE — Addendum Note (Signed)
Addended by: Wilford Corner on: 08/11/2020 03:26 PM   Modules accepted: Orders

## 2020-08-11 NOTE — Telephone Encounter (Signed)
Caller states she would like a lab order sent to [facility] in [place], Patient lives in [place].  Fax Number : [phone] [facility]      Laboratory testing  At: [facility] [address] [place], [place] [address]  ~51.8 mi [phone] Open  Closes 4:30 PM

## 2020-08-13 ENCOUNTER — Other Ambulatory Visit: Payer: Medicare Other

## 2020-08-13 ENCOUNTER — Other Ambulatory Visit: Payer: Self-pay | Admitting: Family

## 2020-08-14 LAB — TSH: TSH: 0.078 u[IU]/mL — ABNORMAL LOW (ref 0.450–4.500)

## 2020-08-14 LAB — BASIC METABOLIC PANEL
BUN/Creatinine Ratio: 12 (ref 12–28)
BUN: 9 mg/dL (ref 8–27)
CO2: 16 mmol/L — ABNORMAL LOW (ref 20–29)
Calcium: 9.7 mg/dL (ref 8.7–10.3)
Chloride: 103 mmol/L (ref 96–106)
Creatinine, Ser: 0.75 mg/dL (ref 0.57–1.00)
Glucose: 111 mg/dL — ABNORMAL HIGH (ref 65–99)
Potassium: 4.3 mmol/L (ref 3.5–5.2)
Sodium: 141 mmol/L (ref 134–144)
eGFR: 82 mL/min/{1.73_m2} (ref >59)

## 2020-08-14 LAB — HEMOGLOBIN A1C
Est. average glucose Bld gHb Est-mCnc: 111 mg/dL
Hgb A1c MFr Bld: 5.5 % (ref 4.8–5.6)

## 2020-08-16 ENCOUNTER — Telehealth: Payer: Self-pay | Admitting: Family

## 2020-08-16 DIAGNOSIS — E878 Other disorders of electrolyte and fluid balance, not elsewhere classified: Secondary | ICD-10-CM

## 2020-08-16 NOTE — Telephone Encounter (Signed)
Patient advised of results and scheduled to come in 08-25-20 for labs

## 2020-08-16 NOTE — Telephone Encounter (Signed)
Sugar is mildly elevated but stable. Her CO2 level is a little low. I would like her to repeat bmet in 1 week please.

## 2020-08-18 ENCOUNTER — Other Ambulatory Visit: Payer: Medicare Other

## 2020-08-19 ENCOUNTER — Ambulatory Visit (HOSPITAL_BASED_OUTPATIENT_CLINIC_OR_DEPARTMENT_OTHER)
Admission: RE | Admit: 2020-08-19 | Discharge: 2020-08-19 | Disposition: A | Discharge status: Home / Self Care | Payer: Medicare Other | Source: Ambulatory Visit | Attending: Family | Admitting: Family

## 2020-08-19 ENCOUNTER — Other Ambulatory Visit: Payer: Self-pay

## 2020-08-19 DIAGNOSIS — Z780 Asymptomatic menopausal state: Secondary | ICD-10-CM | POA: Diagnosis not present

## 2020-08-19 DIAGNOSIS — M810 Age-related osteoporosis without current pathological fracture: Secondary | ICD-10-CM | POA: Insufficient documentation

## 2020-08-19 DIAGNOSIS — M8589 Other specified disorders of bone density and structure, multiple sites: Secondary | ICD-10-CM | POA: Diagnosis not present

## 2020-08-23 ENCOUNTER — Telehealth: Payer: Self-pay

## 2020-08-23 NOTE — Telephone Encounter (Signed)
-----   Message from Sandford Craze, NP sent at 08/23/2020  2:02 PM EDT ----- Please advise pt that her bone density has worsened significantly and shows severe osteoporosis.  I would strongly recommend a twice a year injection (Prolia).  She has previously declined but I would encourage her to think about it and let me know if she wants to proceed. This should help her bones and decrease risk of severe fractures.

## 2020-08-23 NOTE — Telephone Encounter (Signed)
Called patient with results and recommendations, she agrees with getting Prolia injections.  Please check for benefits.

## 2020-08-25 ENCOUNTER — Other Ambulatory Visit: Payer: Self-pay

## 2020-08-25 ENCOUNTER — Other Ambulatory Visit (INDEPENDENT_AMBULATORY_CARE_PROVIDER_SITE_OTHER): Payer: Medicare Other

## 2020-08-25 DIAGNOSIS — E878 Other disorders of electrolyte and fluid balance, not elsewhere classified: Secondary | ICD-10-CM

## 2020-08-25 DIAGNOSIS — R739 Hyperglycemia, unspecified: Secondary | ICD-10-CM | POA: Diagnosis not present

## 2020-08-25 DIAGNOSIS — E039 Hypothyroidism, unspecified: Secondary | ICD-10-CM

## 2020-08-25 DIAGNOSIS — I10 Essential (primary) hypertension: Secondary | ICD-10-CM

## 2020-08-25 NOTE — Telephone Encounter (Signed)
Clinical info submitted to insurance company. Waiting on summary of benefits to determine coverage. Will call patient to discuss once received.  

## 2020-08-26 ENCOUNTER — Telehealth: Payer: Self-pay | Admitting: Family

## 2020-08-26 DIAGNOSIS — E039 Hypothyroidism, unspecified: Secondary | ICD-10-CM

## 2020-08-26 LAB — BASIC METABOLIC PANEL
BUN/Creatinine Ratio: 13 (ref 12–28)
BUN: 9 mg/dL (ref 8–27)
CO2: 19 mmol/L — ABNORMAL LOW (ref 20–29)
Calcium: 9.7 mg/dL (ref 8.7–10.3)
Chloride: 103 mmol/L (ref 96–106)
Creatinine, Ser: 0.7 mg/dL (ref 0.57–1.00)
Glucose: 96 mg/dL (ref 65–99)
Potassium: 4.3 mmol/L (ref 3.5–5.2)
Sodium: 139 mmol/L (ref 134–144)
eGFR: 90 mL/min/{1.73_m2} (ref >59)

## 2020-08-26 LAB — TSH: TSH: 0.046 u[IU]/mL — ABNORMAL LOW (ref 0.450–4.500)

## 2020-08-26 LAB — HEMOGLOBIN A1C
Est. average glucose Bld gHb Est-mCnc: 108 mg/dL
Hgb A1c MFr Bld: 5.4 % (ref 4.8–5.6)

## 2020-08-26 LAB — SPECIMEN STATUS REPORT

## 2020-08-26 MED ORDER — LEVOTHYROXINE SODIUM 75 MCG PO TABS: 75.0000 ug | ORAL_TABLET | Freq: Every day | ORAL | 0 refills | Status: DC

## 2020-08-26 NOTE — Telephone Encounter (Signed)
Based on lab results, I would like her to decrease synthroid from 88 to 75 mcg and repeat TSH in 6 weeks.

## 2020-08-26 NOTE — Telephone Encounter (Signed)
Patient advised of results and new rx. She was scheduled for tsh 10-12-20

## 2020-10-12 ENCOUNTER — Other Ambulatory Visit: Payer: Medicare Other

## 2020-10-15 NOTE — Telephone Encounter (Signed)
Patient called to follow up on Prolia and has a few questions in regards to it.

## 2020-10-22 ENCOUNTER — Encounter: Payer: Self-pay | Admitting: Family

## 2020-10-22 ENCOUNTER — Other Ambulatory Visit (INDEPENDENT_AMBULATORY_CARE_PROVIDER_SITE_OTHER): Payer: Medicare Other

## 2020-10-22 ENCOUNTER — Other Ambulatory Visit: Payer: Self-pay

## 2020-10-22 DIAGNOSIS — E039 Hypothyroidism, unspecified: Secondary | ICD-10-CM

## 2020-10-22 LAB — TSH: TSH: 0.4 u[IU]/mL (ref 0.35–5.50)

## 2020-11-03 NOTE — Progress Notes (Signed)
Mailed out to patient 

## 2020-11-24 ENCOUNTER — Other Ambulatory Visit: Payer: Self-pay | Admitting: Family

## 2020-11-27 ENCOUNTER — Other Ambulatory Visit: Payer: Self-pay | Admitting: Family

## 2020-11-30 NOTE — Telephone Encounter (Signed)
Patient has questions about Prolia, she would like someone to call her and tell her about it.

## 2020-12-01 NOTE — Telephone Encounter (Signed)
Ok to schedule patient prolia.

## 2020-12-01 NOTE — Telephone Encounter (Signed)
Called patient about the appointment for prolia, she has additional questions about the medication. Decided to wait until her next appointment in November.

## 2021-01-06 ENCOUNTER — Ambulatory Visit (INDEPENDENT_AMBULATORY_CARE_PROVIDER_SITE_OTHER): Payer: Medicare Other

## 2021-01-06 VITALS — Ht 60.0 in | Wt 115.0 lb

## 2021-01-06 DIAGNOSIS — Z0000 Encounter for general adult medical examination without abnormal findings: Secondary | ICD-10-CM | POA: Diagnosis not present

## 2021-01-06 NOTE — Progress Notes (Signed)
Subjective:   Jill Armstrong is a 75 y.o. female who presents for Medicare Annual (Subsequent) preventive examination.  I connected with Jill Armstrong today by telephone and verified that I am speaking with the correct person using two identifiers. Location patient: home Location provider: work Persons participating in the virtual visit: patient, Engineer, civil (consulting).    I discussed the limitations, risks, security and privacy concerns of performing an evaluation and management service by telephone and the availability of in person appointments. I also discussed with the patient that there may be a patient responsible charge related to this service. The patient expressed understanding and verbally consented to this telephonic visit.    Interactive audio and video telecommunications were attempted between this provider and patient, however failed, due to patient having technical difficulties OR patient did not have access to video capability.  We continued and completed visit with audio only.  Some vital signs may be absent or patient reported.   Time Spent with patient on telephone encounter: 20 minutes   Review of Systems     Cardiac Risk Factors include: advanced age (>51men, >45 women);hypertension;dyslipidemia;sedentary lifestyle;smoking/ tobacco exposure     Objective:    Today's Vitals   01/06/21 1104  Weight: 115 lb (52.2 kg)  Height: 5' (1.524 m)   Body mass index is 22.46 kg/m.  Advanced Directives 01/06/2021 12/30/2018 12/24/2017 12/18/2016 02/23/2015  Does Patient Have a Medical Advance Directive? No No No No No  Would patient like information on creating a medical advance directive? Yes (MAU/Ambulatory/Procedural Areas - Information given) No - Patient declined Yes (MAU/Ambulatory/Procedural Areas - Information given) Yes (MAU/Ambulatory/Procedural Areas - Information given) Yes - Educational materials given    Current Medications (verified) Outpatient Encounter Medications as of  01/06/2021  Medication Sig   amLODipine (NORVASC) 5 MG tablet Take 1 tablet (5 mg total) by mouth daily.   Calcium Carbonate-Vitamin D 600-400 MG-UNIT tablet Take 1 tablet by mouth 2 (two) times daily. Reported on 07/13/2015   EUTHYROX 75 MCG tablet Take 1 tablet by mouth once daily   Multiple Vitamins-Minerals (MULTIVITAMIN WITH MINERALS) tablet Take 1 tablet by mouth daily.   No facility-administered encounter medications on file as of 01/06/2021.    Allergies (verified) Fosamax [alendronate sodium]   History: Past Medical History:  Diagnosis Date   Arthritis    osteoarthritis right hip   Osteopenia    Thyroid disease    hypothyroidism   Tobacco abuse    Past Surgical History:  Procedure Laterality Date   EYE SURGERY     TONSILLECTOMY AND ADENOIDECTOMY     TOTAL HIP ARTHROPLASTY  04/10/2005   right hip   TUBAL LIGATION     Family History  Adopted: Yes  Family history unknown: Yes   Social History   Socioeconomic History   Marital status: Divorced    Spouse name: Not on file   Number of children: 4   Years of education: Not on file   Highest education level: Not on file  Occupational History   Not on file  Tobacco Use   Smoking status: Every Day    Packs/day: 1.50    Years: 45.00    Pack years: 67.50    Types: Cigarettes   Smokeless tobacco: Never  Substance and Sexual Activity   Alcohol use: Yes    Comment: couple of times per week   Drug use: No   Sexual activity: Never  Other Topics Concern   Not on file  Social History Narrative  Caffeine Use:  2-6 cups daily   Regular exercise:  No   4 children, one in [place], 1 in [place], 1 in [place], 1 in [place]   1 grandchildren   Retired from department store.    Social Determinants of Health   Financial Resource Strain: Low Risk    Difficulty of Paying Living Expenses: Not hard at all  Food Insecurity: No Food Insecurity   Worried About Programme researcher, broadcasting/film/video in the Last Year: Never true   Ran Out of  Food in the Last Year: Never true  Transportation Needs: No Transportation Needs   Lack of Transportation (Medical): No   Lack of Transportation (Non-Medical): No  Physical Activity: Inactive   Days of Exercise per Week: 0 days   Minutes of Exercise per Session: 0 min  Stress: No Stress Concern Present   Feeling of Stress : Not at all  Social Connections: Socially Isolated   Frequency of Communication with Friends and Family: More than three times a week   Frequency of Social Gatherings with Friends and Family: More than three times a week   Attends Religious Services: Never   Database administrator or Organizations: No   Attends Engineer, structural: Never   Marital Status: Divorced    Tobacco Counseling Ready to quit: Not Answered Counseling given: Not Answered   Clinical Intake:  Pre-visit preparation completed: Yes  Pain : No/denies pain     BMI - recorded: 22.46 Nutritional Status: BMI of 19-24  Normal Nutritional Risks: None Diabetes: No  How often do you need to have someone help you when you read instructions, pamphlets, or other written materials from your doctor or pharmacy?: 1 - Never  Diabetic?No  Interpreter Needed?: No  Information entered by :: [employer]   Activities of Daily Living In your present state of health, do you have any difficulty performing the following activities: 01/06/2021 02/11/2020  Hearing? N N  Vision? N N  Difficulty concentrating or making decisions? N N  Walking or climbing stairs? N N  Dressing or bathing? N N  Doing errands, shopping? N N  Preparing Food and eating ? N -  Using the Toilet? N -  In the past six months, have you accidently leaked urine? N -  Do you have problems with loss of bowel control? N -  Managing your Medications? N -  Managing your Finances? N -  Housekeeping or managing your Housekeeping? N -  Some recent data might be hidden    Patient Care Team: Sandford Craze, NP as  PCP - General (Internal Medicine)  Indicate any recent Medical Services you may have received from other than [facility] providers in the past year (date may be approximate).     Assessment:   This is a routine wellness examination for [facility].  Hearing/Vision screen Hearing Screening - Comments:: C/O mild hearing loss Vision Screening - Comments:: Last eye exam-3 months ago-Dr. Dan Humphreys  Dietary issues and exercise activities discussed: Current Exercise Habits: The patient does not participate in regular exercise at present, Exercise limited by: None identified   Goals Addressed               This Visit's Progress     Patient Stated     COMPLETED: Lose 20 lbs by next year.   (pt-stated)        Other     Patient Stated        Increase activity  Depression Screen PHQ 2/9 Scores 01/06/2021 08/10/2020 02/11/2020 12/30/2018 12/24/2017 12/18/2016 07/19/2016  PHQ - 2 Score 0 0 0 0 0 0 0  PHQ- 9 Score - - - - - - 0    Fall Risk Fall Risk  01/06/2021 08/10/2020 02/11/2020 12/30/2018 12/24/2017  Falls in the past year? 0 0 0 0 No  Number falls in past yr: 0 0 0 - -  Injury with Fall? 0 0 0 - -  Follow up Falls prevention discussed - - - -    FALL RISK PREVENTION PERTAINING TO THE HOME:  Any stairs in or around the home? Yes  If so, are there any without handrails? No  Home free of loose throw rugs in walkways, pet beds, electrical cords, etc? Yes  Adequate lighting in your home to reduce risk of falls? Yes   ASSISTIVE DEVICES UTILIZED TO PREVENT FALLS:  Life alert? No  Use of a cane, walker or w/c? No  Grab bars in the bathroom? Yes  Shower chair or bench in shower? No  Elevated toilet seat or a handicapped toilet? No   TIMED UP AND GO:  Was the test performed? No . Phone visit   Cognitive Function:Normal cognitive status assessed by this Nurse Health Advisor. No abnormalities found.   MMSE - Mini Mental State Exam 12/18/2016 02/23/2015  Orientation to time 5 5   Orientation to Place 5 5  Registration 3 3  Attention/ Calculation 5 5  Recall 3 3  Language- name 2 objects 2 2  Language- repeat 1 1  Language- follow 3 step command 3 3  Language- read & follow direction 1 1  Write a sentence 1 1  Copy design 1 1  Total score 30 30        Immunizations Immunization History  Administered Date(s) Administered   Fluad Quad(high Dose 65+) 02/11/2020   Influenza Split 02/27/2012   Influenza, High Dose Seasonal PF 04/22/2013, 01/26/2015, 01/19/2016, 01/23/2017   Moderna Sars-Covid-2 Vaccination 05/15/2019, 06/11/2019, 06/17/2020   Pneumococcal Conjugate-13 02/23/2015   Pneumococcal Polysaccharide-23 03/29/2007, 04/22/2013   Td 06/20/2005, 07/13/2015    TDAP status: Up to date  Flu Vaccine status: Due, Education has been provided regarding the importance of this vaccine. Advised may receive this vaccine at local pharmacy or Health Dept. Aware to provide a copy of the vaccination record if obtained from local pharmacy or Health Dept. Verbalized acceptance and understanding.  Pneumococcal vaccine status: Up to date  Covid-19 vaccine status: Information provided on how to obtain vaccines. Booster due.  Qualifies for Shingles Vaccine? Yes   Zostavax completed No   Shingrix Completed?: No.    Education has been provided regarding the importance of this vaccine. Patient has been advised to call insurance company to determine out of pocket expense if they have not yet received this vaccine. Advised may also receive vaccine at local pharmacy or Health Dept. Verbalized acceptance and understanding.  Screening Tests Health Maintenance  Topic Date Due   Hepatitis C Screening  Never done   Zoster Vaccines- Shingrix (1 of 2) Never done   URINE MICROALBUMIN  09/10/2013   COVID-19 Vaccine (4 - Booster for Moderna series) 09/09/2020   INFLUENZA VACCINE  11/08/2020   TETANUS/TDAP  07/12/2025   DEXA SCAN  Completed   HPV VACCINES  Aged Out    Health  Maintenance  Health Maintenance Due  Topic Date Due   Hepatitis C Screening  Never done   Zoster Vaccines- Shingrix (1 of 2) Never  done   URINE MICROALBUMIN  09/10/2013   COVID-19 Vaccine (4 - Booster for Moderna series) 09/09/2020   INFLUENZA VACCINE  11/08/2020    Colorectal cancer screening: No longer required.   Mammogram status: Due-Patient wishes to call herself to schedule.  Bone Density status: Completed 08/19/2020. Results reflect: Bone density results: OSTEOPOROSIS. Repeat every 2 years.  Lung Cancer Screening: (Low Dose CT Chest recommended if Age 62-80 years, 30 pack-year currently smoking OR have quit w/in 15years.) does qualify.   Lung Cancer Screening Referral: Declined today. Patient states she will discuss with PCP.  Additional Screening:  Hepatitis C Screening: does qualify; Patient to discuss with PCP at next office visit.  Vision Screening: Recommended annual ophthalmology exams for early detection of glaucoma and other disorders of the eye. Is the patient up to date with their annual eye exam?  Yes  Who is the provider or what is the name of the office in which the patient attends annual eye exams? Dr. Dan Humphreys   Dental Screening: Recommended annual dental exams for proper oral hygiene  Community Resource Referral / Chronic Care Management: CRR required this visit?  No   CCM required this visit?  No      Plan:     I have personally reviewed and noted the following in the patient's chart:   Medical and social history Use of alcohol, tobacco or illicit drugs  Current medications and supplements including opioid prescriptions.  Functional ability and status Nutritional status Physical activity Advanced directives List of other physicians Hospitalizations, surgeries, and ER visits in previous 12 months Vitals Screenings to include cognitive, depression, and falls Referrals and appointments  In addition, I have reviewed and discussed with patient  certain preventive protocols, quality metrics, and best practice recommendations. A written personalized care plan for preventive services as well as general preventive health recommendations were provided to patient.   Due to this being a telephonic visit, the after visit summary with patients personalized plan was offered to patient via mail or my-chart.  Per request, patient was mailed a copy of AVS.   Roanna Raider, LPN   [phone]  Nurse Health Advisor  Nurse Notes: None

## 2021-01-06 NOTE — Patient Instructions (Addendum)
Ms. Jill Armstrong , Thank you for taking time to complete your Medicare Wellness Visit. I appreciate your ongoing commitment to your health goals. Please review the following plan we discussed and let me know if I can assist you in the future.   Screening recommendations/referrals: Colonoscopy: Screening no longer required after age 76. Mammogram: Per our conversation, you will call to schedule. Bone Density: Completed 08/19/2020-Due 08/20/2022 Recommended yearly ophthalmology/optometry visit for glaucoma screening and checkup Recommended yearly dental visit for hygiene and checkup. Lung Cancer Screening: Per our conversation, you will discuss with PCP.  Vaccinations: Influenza vaccine: Due-May obtain vaccine at our office or your local pharmacy. Pneumococcal vaccine: Up to date Tdap vaccine: Up to date-Due-07/12/2025 Shingles vaccine: Discuss with pharmacy   Covid-19:Booster available at the pharmacy.  Advanced directives: Information mailed today.  Conditions/risks identified: See problem list  Next appointment: Follow up in one year for your annual wellness visit    Preventive Care 65 Years and Older, Female Preventive care refers to lifestyle choices and visits with your health care provider that can promote health and wellness. What does preventive care include? A yearly physical exam. This is also called an annual well check. Dental exams once or twice a year. Routine eye exams. Ask your health care provider how often you should have your eyes checked. Personal lifestyle choices, including: Daily care of your teeth and gums. Regular physical activity. Eating a healthy diet. Avoiding tobacco and drug use. Limiting alcohol use. Practicing safe sex. Taking low-dose aspirin every day. Taking vitamin and mineral supplements as recommended by your health care provider. What happens during an annual well check? The services and screenings done by your health care provider during your  annual well check will depend on your age, overall health, lifestyle risk factors, and family history of disease. Counseling  Your health care provider may ask you questions about your: Alcohol use. Tobacco use. Drug use. Emotional well-being. Home and relationship well-being. Sexual activity. Eating habits. History of falls. Memory and ability to understand (cognition). Work and work Astronomer. Reproductive health. Screening  You may have the following tests or measurements: Height, weight, and BMI. Blood pressure. Lipid and cholesterol levels. These may be checked every 5 years, or more frequently if you are over 46 years old. Skin check. Lung cancer screening. You may have this screening every year starting at age 56 if you have a 30-pack-year history of smoking and currently smoke or have quit within the past 15 years. Fecal occult blood test (FOBT) of the stool. You may have this test every year starting at age 23. Flexible sigmoidoscopy or colonoscopy. You may have a sigmoidoscopy every 5 years or a colonoscopy every 10 years starting at age 68. Hepatitis C blood test. Hepatitis B blood test. Sexually transmitted disease (STD) testing. Diabetes screening. This is done by checking your blood sugar (glucose) after you have not eaten for a while (fasting). You may have this done every 1-3 years. Bone density scan. This is done to screen for osteoporosis. You may have this done starting at age 33. Mammogram. This may be done every 1-2 years. Talk to your health care provider about how often you should have regular mammograms. Talk with your health care provider about your test results, treatment options, and if necessary, the need for more tests. Vaccines  Your health care provider may recommend certain vaccines, such as: Influenza vaccine. This is recommended every year. Tetanus, diphtheria, and acellular pertussis (Tdap, Td) vaccine. You may need a Td  booster every 10  years. Zoster vaccine. You may need this after age 62. Pneumococcal 13-valent conjugate (PCV13) vaccine. One dose is recommended after age 69. Pneumococcal polysaccharide (PPSV23) vaccine. One dose is recommended after age 18. Talk to your health care provider about which screenings and vaccines you need and how often you need them. This information is not intended to replace advice given to you by your health care provider. Make sure you discuss any questions you have with your health care provider. Document Released: 04/23/2015 Document Revised: 12/15/2015 Document Reviewed: 01/26/2015 Elsevier Interactive Patient Education  2017 [employer] Prevention in the Home Falls can cause injuries. They can happen to people of all ages. There are many things you can do to make your home safe and to help prevent falls. What can I do on the outside of my home? Regularly fix the edges of walkways and driveways and fix any cracks. Remove anything that might make you trip as you walk through a door, such as a raised step or threshold. Trim any bushes or trees on the path to your home. Use bright outdoor lighting. Clear any walking paths of anything that might make someone trip, such as rocks or tools. Regularly check to see if handrails are loose or broken. Make sure that both sides of any steps have handrails. Any raised decks and porches should have guardrails on the edges. Have any leaves, snow, or ice cleared regularly. Use sand or salt on walking paths during winter. Clean up any spills in your garage right away. This includes oil or grease spills. What can I do in the bathroom? Use night lights. Install grab bars by the toilet and in the tub and shower. Do not use towel bars as grab bars. Use non-skid mats or decals in the tub or shower. If you need to sit down in the shower, use a plastic, non-slip stool. Keep the floor dry. Clean up any water that spills on the floor as soon as it  happens. Remove soap buildup in the tub or shower regularly. Attach bath mats securely with double-sided non-slip rug tape. Do not have throw rugs and other things on the floor that can make you trip. What can I do in the bedroom? Use night lights. Make sure that you have a light by your bed that is easy to reach. Do not use any sheets or blankets that are too big for your bed. They should not hang down onto the floor. Have a firm chair that has side arms. You can use this for support while you get dressed. Do not have throw rugs and other things on the floor that can make you trip. What can I do in the kitchen? Clean up any spills right away. Avoid walking on wet floors. Keep items that you use a lot in easy-to-reach places. If you need to reach something above you, use a strong step stool that has a grab bar. Keep electrical cords out of the way. Do not use floor polish or wax that makes floors slippery. If you must use wax, use non-skid floor wax. Do not have throw rugs and other things on the floor that can make you trip. What can I do with my stairs? Do not leave any items on the stairs. Make sure that there are handrails on both sides of the stairs and use them. Fix handrails that are broken or loose. Make sure that handrails are as long as the stairways. Check any carpeting  to make sure that it is firmly attached to the stairs. Fix any carpet that is loose or worn. Avoid having throw rugs at the top or bottom of the stairs. If you do have throw rugs, attach them to the floor with carpet tape. Make sure that you have a light switch at the top of the stairs and the bottom of the stairs. If you do not have them, ask someone to add them for you. What else can I do to help prevent falls? Wear shoes that: Do not have high heels. Have rubber bottoms. Are comfortable and fit you well. Are closed at the toe. Do not wear sandals. If you use a stepladder: Make sure that it is fully opened.  Do not climb a closed stepladder. Make sure that both sides of the stepladder are locked into place. Ask someone to hold it for you, if possible. Clearly mark and make sure that you can see: Any grab bars or handrails. First and last steps. Where the edge of each step is. Use tools that help you move around (mobility aids) if they are needed. These include: Canes. Walkers. Scooters. Crutches. Turn on the lights when you go into a dark area. Replace any light bulbs as soon as they burn out. Set up your furniture so you have a clear path. Avoid moving your furniture around. If any of your floors are uneven, fix them. If there are any pets around you, be aware of where they are. Review your medicines with your doctor. Some medicines can make you feel dizzy. This can increase your chance of falling. Ask your doctor what other things that you can do to help prevent falls. This information is not intended to replace advice given to you by your health care provider. Make sure you discuss any questions you have with your health care provider. Document Released: 01/21/2009 Document Revised: 09/02/2015 Document Reviewed: 05/01/2014 Elsevier Interactive Patient Education  2017 [employer].

## 2021-02-11 ENCOUNTER — Ambulatory Visit: Payer: Medicare Other | Admitting: Family

## 2021-02-11 NOTE — Progress Notes (Incomplete)
Subjective:   By signing my name below, I, Lyric Barr-McArthur, attest that this documentation has been prepared under the direction and in the presence of Sandford Craze, NP, 02/11/2021     Patient ID: Jill Armstrong, female    DOB: 12/13/1945, 75 y.o.   MRN: [id number]  No chief complaint on file.   HPI Patient is in today for an office visit.    Health Maintenance Due  Topic Date Due   Hepatitis C Screening  Never done   Zoster Vaccines- Shingrix (1 of 2) Never done   URINE MICROALBUMIN  09/10/2013   COVID-19 Vaccine (4 - Booster for Moderna series) 08/12/2020   INFLUENZA VACCINE  11/08/2020    Past Medical History:  Diagnosis Date   Arthritis    osteoarthritis right hip   Osteopenia    Thyroid disease    hypothyroidism   Tobacco abuse     Past Surgical History:  Procedure Laterality Date   EYE SURGERY     TONSILLECTOMY AND ADENOIDECTOMY     TOTAL HIP ARTHROPLASTY  04/10/2005   right hip   TUBAL LIGATION      Family History  Adopted: Yes  Family history unknown: Yes    Social History   Socioeconomic History   Marital status: Divorced    Spouse name: Not on file   Number of children: 4   Years of education: Not on file   Highest education level: Not on file  Occupational History   Not on file  Tobacco Use   Smoking status: Every Day    Packs/day: 1.50    Years: 45.00    Pack years: 67.50    Types: Cigarettes   Smokeless tobacco: Never  Substance and Sexual Activity   Alcohol use: Yes    Comment: couple of times per week   Drug use: No   Sexual activity: Never  Other Topics Concern   Not on file  Social History Narrative   Caffeine Use:  2-6 cups daily   Regular exercise:  No   4 children, one in [place], 1 in [place], 1 in [place], 1 in [place]   1 grandchildren   Retired from department store.    Social Determinants of Health   Financial Resource Strain: Low Risk    Difficulty of Paying Living Expenses: Not hard at all   Food Insecurity: No Food Insecurity   Worried About Programme researcher, broadcasting/film/video in the Last Year: Never true   Ran Out of Food in the Last Year: Never true  Transportation Needs: No Transportation Needs   Lack of Transportation (Medical): No   Lack of Transportation (Non-Medical): No  Physical Activity: Inactive   Days of Exercise per Week: 0 days   Minutes of Exercise per Session: 0 min  Stress: No Stress Concern Present   Feeling of Stress : Not at all  Social Connections: Socially Isolated   Frequency of Communication with Friends and Family: More than three times a week   Frequency of Social Gatherings with Friends and Family: More than three times a week   Attends Religious Services: Never   Database administrator or Organizations: No   Attends Banker Meetings: Never   Marital Status: Divorced  Catering manager Violence: Not At Risk   Fear of Current or Ex-Partner: No   Emotionally Abused: No   Physically Abused: No   Sexually Abused: No    Outpatient Medications Prior to Visit  Medication Sig Dispense  Refill   amLODipine (NORVASC) 5 MG tablet Take 1 tablet (5 mg total) by mouth daily. 90 tablet 0   Calcium Carbonate-Vitamin D 600-400 MG-UNIT tablet Take 1 tablet by mouth 2 (two) times daily. Reported on 07/13/2015     EUTHYROX 75 MCG tablet Take 1 tablet by mouth once daily 90 tablet 0   Multiple Vitamins-Minerals (MULTIVITAMIN WITH MINERALS) tablet Take 1 tablet by mouth daily.     No facility-administered medications prior to visit.    Allergies  Allergen Reactions   Fosamax [Alendronate Sodium] Nausea And Vomiting    ROS     Objective:    Physical Exam Constitutional:      General: She is not in acute distress.    Appearance: Normal appearance. She is not ill-appearing.  HENT:     Head: Normocephalic and atraumatic.     Right Ear: External ear normal.     Left Ear: External ear normal.  Eyes:     Extraocular Movements: Extraocular movements intact.      Pupils: Pupils are equal, round, and reactive to light.  Cardiovascular:     Rate and Rhythm: Normal rate and regular rhythm.     Heart sounds: Normal heart sounds. No murmur heard.   No gallop.  Pulmonary:     Effort: Pulmonary effort is normal. No respiratory distress.     Breath sounds: Normal breath sounds. No wheezing or rales.  Lymphadenopathy:     Cervical: No cervical adenopathy.  Skin:    General: Skin is warm and dry.  Neurological:     Mental Status: She is alert and oriented to person, place, and time.  Psychiatric:        Behavior: Behavior normal.        Judgment: Judgment normal.    There were no vitals taken for this visit. Wt Readings from Last 3 Encounters:  01/06/21 115 lb (52.2 kg)  08/10/20 115 lb 12.8 oz (52.5 kg)  02/11/20 115 lb (52.2 kg)       Assessment & Plan:   Problem List Items Addressed This Visit   None  No orders of the defined types were placed in this encounter.   I, Sandford Craze, NP, personally preformed the services described in this documentation.  All medical record entries made by the scribe were at my direction and in my presence.  I have reviewed the chart and discharge instructions (if applicable) and agree that the record reflects my personal performance and is accurate and complete. 02/11/2021  I,Lyric Barr-McArthur,acting as a Neurosurgeon for Lemont Fillers, NP.,have documented all relevant documentation on the behalf of Lemont Fillers, NP,as directed by  Lemont Fillers, NP while in the presence of Lemont Fillers, NP.  Lyric Barr-McArthur

## 2021-03-08 ENCOUNTER — Other Ambulatory Visit: Payer: Self-pay | Admitting: Family

## 2021-03-11 ENCOUNTER — Other Ambulatory Visit: Payer: Self-pay | Admitting: Family

## 2021-03-21 ENCOUNTER — Ambulatory Visit (INDEPENDENT_AMBULATORY_CARE_PROVIDER_SITE_OTHER): Payer: Medicare Other | Admitting: Family

## 2021-03-21 VITALS — BP 139/82 | HR 88 | Temp 98.6°F | Resp 16 | Wt 124.6 lb

## 2021-03-21 DIAGNOSIS — E039 Hypothyroidism, unspecified: Secondary | ICD-10-CM

## 2021-03-21 DIAGNOSIS — R739 Hyperglycemia, unspecified: Secondary | ICD-10-CM | POA: Diagnosis not present

## 2021-03-21 DIAGNOSIS — Z23 Encounter for immunization: Secondary | ICD-10-CM | POA: Diagnosis not present

## 2021-03-21 DIAGNOSIS — Z1231 Encounter for screening mammogram for malignant neoplasm of breast: Secondary | ICD-10-CM | POA: Diagnosis not present

## 2021-03-21 DIAGNOSIS — L723 Sebaceous cyst: Secondary | ICD-10-CM | POA: Diagnosis not present

## 2021-03-21 DIAGNOSIS — Z1159 Encounter for screening for other viral diseases: Secondary | ICD-10-CM

## 2021-03-21 DIAGNOSIS — I10 Essential (primary) hypertension: Secondary | ICD-10-CM

## 2021-03-21 LAB — BASIC METABOLIC PANEL
BUN: 8 mg/dL (ref 6–23)
CO2: 24 mEq/L (ref 19–32)
Calcium: 9.6 mg/dL (ref 8.4–10.5)
Chloride: 102 mEq/L (ref 96–112)
Creatinine, Ser: 0.69 mg/dL (ref 0.40–1.20)
GFR: 84.04 mL/min (ref >60.00)
Glucose, Bld: 122 mg/dL — ABNORMAL HIGH (ref 70–99)
Potassium: 3.9 mEq/L (ref 3.5–5.1)
Sodium: 137 mEq/L (ref 135–145)

## 2021-03-21 LAB — TSH: TSH: 12.42 u[IU]/mL — ABNORMAL HIGH (ref 0.35–5.50)

## 2021-03-21 MED ORDER — AMLODIPINE BESYLATE 5 MG PO TABS: 5.0000 mg | ORAL_TABLET | Freq: Every day | ORAL | 1 refills | Status: DC

## 2021-03-21 MED ORDER — LEVOTHYROXINE SODIUM 75 MCG PO TABS: 75.0000 ug | ORAL_TABLET | Freq: Every day | ORAL | 1 refills | Status: DC

## 2021-03-21 MED ORDER — SHINGRIX 50 MCG/0.5ML IM SUSR: INTRAMUSCULAR | 1 refills | Status: AC

## 2021-03-21 NOTE — Patient Instructions (Signed)
Please complete lab work prior to leaving.   

## 2021-03-21 NOTE — Assessment & Plan Note (Addendum)
Lab Results  Component Value Date   TSH 0.40 10/22/2020   Clinically stable. Obtain follow up TSH. Continue synthroid.

## 2021-03-21 NOTE — Progress Notes (Signed)
Subjective:   By signing my name below, I, Jill Armstrong, attest that this documentation has been prepared under the direction and in the presence of Jill Craze NP. 03/21/2021    Patient ID: Jill Armstrong, female    DOB: March 31, 1946, 75 y.o.   MRN: [id number]  Chief Complaint  Patient presents with   Hypertension    Here for follow up   Hypothyroidism    Here for follow up    Hypertension  Patient is in today for a office visit.   Cyst- She continues having a cyst on her right cheek.  Moles- She reports having two new moles in her left back.  Blood pressure- Her blood pressure is doing well during this visit. She continues taking 5 mg amlodipine daily PO and reports no new issues while taking it.  BP Readings from Last 3 Encounters:  03/21/21 139/82  08/10/20 114/72  02/11/20 124/67   Pulse Readings from Last 3 Encounters:  03/21/21 88  08/10/20 (!) 104  02/11/20 96   Thyroid- She continues taking 75 mcg synthroid and reports no new issues while taking it.  Immunizations- She received her flu vaccine during this visit. She has 3 Covid-19 vaccines and was informed of the bivalent Covid-19 vaccines and is interested in receiving it at her pharmacy. She is eligible for the shingles vaccine and is interested in receiving it at her pharmacy. She is interested in receiving hepatitis C screening during her next lab work.    Health Maintenance Due  Topic Date Due   Hepatitis C Screening  Never done   Zoster Vaccines- Shingrix (1 of 2) Never done   COVID-19 Vaccine (4 - Booster for Moderna series) 08/12/2020   INFLUENZA VACCINE  11/08/2020    Past Medical History:  Diagnosis Date   Arthritis    osteoarthritis right hip   Osteopenia    Thyroid disease    hypothyroidism   Tobacco abuse     Past Surgical History:  Procedure Laterality Date   EYE SURGERY     TONSILLECTOMY AND ADENOIDECTOMY     TOTAL HIP ARTHROPLASTY  04/10/2005   right hip   TUBAL LIGATION       Family History  Adopted: Yes  Family history unknown: Yes    Social History   Socioeconomic History   Marital status: Divorced    Spouse name: Not on file   Number of children: 4   Years of education: Not on file   Highest education level: Not on file  Occupational History   Not on file  Tobacco Use   Smoking status: Every Day    Packs/day: 1.50    Years: 45.00    Pack years: 67.50    Types: Cigarettes   Smokeless tobacco: Never  Substance and Sexual Activity   Alcohol use: Yes    Comment: couple of times per week   Drug use: No   Sexual activity: Never  Other Topics Concern   Not on file  Social History Narrative   Caffeine Use:  2-6 cups daily   Regular exercise:  No   4 children, one in [place], 1 in [place] [employer], 1 in [place], 1 in [place]   1 grandchildren   Retired from department store.    Social Determinants of Health   Financial Resource Strain: Low Risk    Difficulty of Paying Living Expenses: Not hard at all  Food Insecurity: No Food Insecurity   Worried About Programme researcher, broadcasting/film/video in  the Last Year: Never true   Ran Out of Food in the Last Year: Never true  Transportation Needs: No Transportation Needs   Lack of Transportation (Medical): No   Lack of Transportation (Non-Medical): No  Physical Activity: Inactive   Days of Exercise per Week: 0 days   Minutes of Exercise per Session: 0 min  Stress: No Stress Concern Present   Feeling of Stress : Not at all  Social Connections: Socially Isolated   Frequency of Communication with Friends and Family: More than three times a week   Frequency of Social Gatherings with Friends and Family: More than three times a week   Attends Religious Services: Never   Database administrator or Organizations: No   Attends Engineer, structural: Never   Marital Status: Divorced  Catering manager Violence: Not At Risk   Fear of Current or Ex-Partner: No   Emotionally Abused: No   Physically Abused: No   Sexually  Abused: No    Outpatient Medications Prior to Visit  Medication Sig Dispense Refill   Calcium Carbonate-Vitamin D 600-400 MG-UNIT tablet Take 1 tablet by mouth 2 (two) times daily. Reported on 07/13/2015     Multiple Vitamins-Minerals (MULTIVITAMIN WITH MINERALS) tablet Take 1 tablet by mouth daily.     amLODipine (NORVASC) 5 MG tablet Take 1 tablet by mouth once daily 90 tablet 0   levothyroxine (SYNTHROID) 75 MCG tablet Take 1 tablet by mouth once daily 90 tablet 0   No facility-administered medications prior to visit.    Allergies  Allergen Reactions   Fosamax [Alendronate Sodium] Nausea And Vomiting    Review of Systems  Skin:        (+)2 moles on left back (+)cyst on right cheek      Objective:    Physical Exam Constitutional:      General: She is not in acute distress.    Appearance: Normal appearance. She is not ill-appearing.  HENT:     Head: Normocephalic and atraumatic.     Right Ear: External ear normal.     Left Ear: External ear normal.  Eyes:     Extraocular Movements: Extraocular movements intact.     Pupils: Pupils are equal, round, and reactive to light.  Cardiovascular:     Rate and Rhythm: Normal rate and regular rhythm.     Heart sounds: Normal heart sounds. No murmur heard.   No gallop.  Pulmonary:     Effort: Pulmonary effort is normal. No respiratory distress.     Breath sounds: Normal breath sounds. No wheezing or rales.  Skin:    General: Skin is warm and dry.     Comments: Sebaceous cyst right cheek Keratosis noted left upper back  Neurological:     Mental Status: She is alert and oriented to person, place, and time.  Psychiatric:        Behavior: Behavior normal.    BP 139/82 (BP Location: Right Arm, Patient Position: Sitting, Cuff Size: Small)   Pulse 88   Temp 98.6 F (37 C) (Oral)   Resp 16   Wt 124 lb 9.6 oz (56.5 kg)   SpO2 100%   BMI 24.33 kg/m  Wt Readings from Last 3 Encounters:  03/21/21 124 lb 9.6 oz (56.5 kg)   01/06/21 115 lb (52.2 kg)  08/10/20 115 lb 12.8 oz (52.5 kg)       Assessment & Plan:   Problem List Items Addressed This Visit  Unprioritized   Hypothyroidism    Lab Results  Component Value Date   TSH 0.40 10/22/2020  Clinically stable. Obtain follow up TSH. Continue synthroid.       Relevant Medications   levothyroxine (SYNTHROID) 75 MCG tablet   Other Relevant Orders   TSH   Hyperglycemia    Lab Results  Component Value Date   HGBA1C 5.4 08/25/2020  Last A1C was WNL.      Relevant Orders   Basic metabolic panel   Essential hypertension    BP Readings from Last 3 Encounters:  03/21/21 139/82  08/10/20 114/72  02/11/20 124/67  BP stable on current dose of amlodipine, continue same.      Relevant Medications   amLODipine (NORVASC) 5 MG tablet   Other Relevant Orders   Basic metabolic panel   Other Visit Diagnoses     Needs flu shot    -  Primary   Relevant Orders   Flu Vaccine QUAD High Dose(Fluad)   Need for hepatitis C screening test       Relevant Orders   Hepatitis C Antibody   Sebaceous cyst       Relevant Orders   Ambulatory referral to Dermatology   Encounter for screening mammogram for malignant neoplasm of breast       Relevant Orders   MM 3D SCREEN BREAST BILATERAL        Meds ordered this encounter  Medications   Zoster Vaccine Adjuvanted [facility]) injection    Sig: Inject 0.5mg  IM now and again in 2-6 months.    Dispense:  0.5 mL    Refill:  1    Order Specific Question:   Supervising Provider    Answer:   Danise Edge A [[id number]]   amLODipine (NORVASC) 5 MG tablet    Sig: Take 1 tablet (5 mg total) by mouth daily.    Dispense:  90 tablet    Refill:  1    Order Specific Question:   Supervising Provider    Answer:   Danise Edge A [[id number]]   levothyroxine (SYNTHROID) 75 MCG tablet    Sig: Take 1 tablet (75 mcg total) by mouth daily.    Dispense:  90 tablet    Refill:  1    Order Specific Question:   Supervising Provider     Answer:   Danise Edge A [[id number]]    I, Jill Craze NP, personally preformed the services described in this documentation.  All medical record entries made by the scribe were at my direction and in my presence.  I have reviewed the chart and discharge instructions (if applicable) and agree that the record reflects my personal performance and is accurate and complete. 03/21/2021   I,Jill Armstrong,acting as a Neurosurgeon for Lemont Fillers, NP.,have documented all relevant documentation on the behalf of Lemont Fillers, NP,as directed by  Lemont Fillers, NP while in the presence of Lemont Fillers, NP.   Lemont Fillers, NP

## 2021-03-21 NOTE — Assessment & Plan Note (Addendum)
Lab Results  Component Value Date   HGBA1C 5.4 08/25/2020   Last A1C was WNL.

## 2021-03-21 NOTE — Assessment & Plan Note (Addendum)
BP Readings from Last 3 Encounters:  03/21/21 139/82  08/10/20 114/72  02/11/20 124/67   BP stable on current dose of amlodipine, continue same.

## 2021-03-22 ENCOUNTER — Telehealth: Payer: Self-pay | Admitting: Family

## 2021-03-22 ENCOUNTER — Other Ambulatory Visit: Payer: Self-pay

## 2021-03-22 DIAGNOSIS — E039 Hypothyroidism, unspecified: Secondary | ICD-10-CM

## 2021-03-22 LAB — HEPATITIS C ANTIBODY
Hepatitis C Ab: NONREACTIVE
SIGNAL TO CUT-OFF: 0.03 (ref <1.00)

## 2021-03-22 MED ORDER — LEVOTHYROXINE SODIUM 100 MCG PO TABS: 100.0000 ug | ORAL_TABLET | Freq: Every day | ORAL | 0 refills | Status: DC

## 2021-03-22 NOTE — Telephone Encounter (Signed)
Results giving to patient, advised of new medication dozes and  appointment given  for labs 05-06-21

## 2021-03-22 NOTE — Telephone Encounter (Signed)
Lab work shows that we should increase her synthroid to 100 mcg.  Repeat TSH in 6 weeks, dx hypothyroid.

## 2021-04-13 ENCOUNTER — Inpatient Hospital Stay (HOSPITAL_BASED_OUTPATIENT_CLINIC_OR_DEPARTMENT_OTHER): Admission: RE | Admit: 2021-04-13 | Payer: Medicare Other | Source: Ambulatory Visit

## 2021-04-20 ENCOUNTER — Ambulatory Visit (HOSPITAL_BASED_OUTPATIENT_CLINIC_OR_DEPARTMENT_OTHER)
Admission: RE | Admit: 2021-04-20 | Discharge: 2021-04-20 | Disposition: A | Discharge status: Home / Self Care | Payer: Medicare Other | Source: Ambulatory Visit | Attending: Family | Admitting: Family

## 2021-04-20 ENCOUNTER — Other Ambulatory Visit: Payer: Self-pay

## 2021-04-20 ENCOUNTER — Encounter (HOSPITAL_BASED_OUTPATIENT_CLINIC_OR_DEPARTMENT_OTHER): Payer: Self-pay

## 2021-04-20 DIAGNOSIS — Z1231 Encounter for screening mammogram for malignant neoplasm of breast: Secondary | ICD-10-CM | POA: Insufficient documentation

## 2021-05-06 ENCOUNTER — Other Ambulatory Visit (INDEPENDENT_AMBULATORY_CARE_PROVIDER_SITE_OTHER): Payer: Medicare Other

## 2021-05-06 DIAGNOSIS — E039 Hypothyroidism, unspecified: Secondary | ICD-10-CM | POA: Diagnosis not present

## 2021-05-06 LAB — TSH: TSH: 0.47 u[IU]/mL (ref 0.35–5.50)

## 2021-05-09 ENCOUNTER — Telehealth: Payer: Self-pay

## 2021-05-09 NOTE — Telephone Encounter (Signed)
Caller Name [place] Phone Number [phone] Call Type Message Only Information Provided Reason for Call Returning a Call from the Office Initial [place] states she is calling because she had a missed call from [facility] Additional Comment Office hours provided. Caller wanted to send a message. Disp. Time Disposition Final User 05/09/2021 11:58:25 AM General Information Provided Yes Susette Racer Call Closed By: Susette Racer Transaction Date/Time: 05/09/2021 11:56:28 AM (ET)

## 2021-05-09 NOTE — Telephone Encounter (Signed)
Spoke to patient

## 2021-07-28 ENCOUNTER — Other Ambulatory Visit: Payer: Self-pay | Admitting: Family

## 2021-09-19 ENCOUNTER — Ambulatory Visit: Payer: Medicare Other | Admitting: Family

## 2021-10-07 ENCOUNTER — Ambulatory Visit (INDEPENDENT_AMBULATORY_CARE_PROVIDER_SITE_OTHER): Payer: Medicare Other | Admitting: Family

## 2021-10-07 DIAGNOSIS — E039 Hypothyroidism, unspecified: Secondary | ICD-10-CM | POA: Diagnosis not present

## 2021-10-07 DIAGNOSIS — E785 Hyperlipidemia, unspecified: Secondary | ICD-10-CM

## 2021-10-07 DIAGNOSIS — I10 Essential (primary) hypertension: Secondary | ICD-10-CM

## 2021-10-07 LAB — BASIC METABOLIC PANEL
BUN: 13 mg/dL (ref 6–23)
CO2: 24 mEq/L (ref 19–32)
Calcium: 9.9 mg/dL (ref 8.4–10.5)
Chloride: 99 mEq/L (ref 96–112)
Creatinine, Ser: 0.67 mg/dL (ref 0.40–1.20)
GFR: 84.31 mL/min (ref >60.00)
Glucose, Bld: 105 mg/dL — ABNORMAL HIGH (ref 70–99)
Potassium: 4.5 mEq/L (ref 3.5–5.1)
Sodium: 139 mEq/L (ref 135–145)

## 2021-10-07 LAB — TSH: TSH: 0.2 u[IU]/mL — ABNORMAL LOW (ref 0.35–5.50)

## 2021-10-07 NOTE — Patient Instructions (Addendum)
Take 1 capful of miralax tonight mixed with 8 oz of beverage of choice.  Please complete lab work prior to leaving.

## 2021-10-07 NOTE — Assessment & Plan Note (Signed)
BP Readings from Last 3 Encounters:  10/07/21 (!) 132/56  03/21/21 139/82  08/10/20 114/72   Stable on amlodipine 5mg . Continue same.

## 2021-10-07 NOTE — Progress Notes (Signed)
Subjective:   By signing my name below, I, [name], attest that this documentation has been prepared under the direction and in the presence of Alma Downs' Suvillivan, NP 10/07/2021.    Patient ID: Jill Armstrong, female    DOB: Mar 22, 1946, 76 y.o.   MRN: [id number]  Chief Complaint  Patient presents with   Hypertension    Here for follow up   Hypothyroidism    Here for follow up    HPI Patient is in today for an office visit.  Constipation: She complains of constipation. She reports symptoms started 3-4 days ago which is the last time she moved her bowels. She has about 3-6 ounces of water a day.  Blood Pressure: Her blood pressure is normal. She is currently taking 5 Mg of Amlodipine.  BP Readings from Last 3 Encounters:  10/07/21 (!) 132/56  03/21/21 139/82  08/10/20 114/72   Pulse Readings from Last 3 Encounters:  10/07/21 96  03/21/21 88  08/10/20 (!) 104   Thyroid: Her thyroid levels are normal. She is currently taking 100 MCG of Synthroid.  Lab Results  Component Value Date   TSH 0.47 05/06/2021   Health Maintenance Due  Topic Date Due   Zoster Vaccines- Shingrix (1 of 2) Never done   COVID-19 Vaccine (4 - Booster for Moderna series) 08/12/2020    Past Medical History:  Diagnosis Date   Arthritis    osteoarthritis right hip   Osteopenia    Thyroid disease    hypothyroidism   Tobacco abuse     Past Surgical History:  Procedure Laterality Date   EYE SURGERY     TONSILLECTOMY AND ADENOIDECTOMY     TOTAL HIP ARTHROPLASTY  04/10/2005   right hip   TUBAL LIGATION      Family History  Adopted: Yes  Family history unknown: Yes    Social History   Socioeconomic History   Marital status: Divorced    Spouse name: Not on file   Number of children: 4   Years of education: Not on file   Highest education level: Not on file  Occupational History   Not on file  Tobacco Use   Smoking status: Every Day    Packs/day: 1.50    Years: 45.00     Total pack years: 67.50    Types: Cigarettes   Smokeless tobacco: Never  Substance and Sexual Activity   Alcohol use: Yes    Comment: couple of times per week   Drug use: No   Sexual activity: Never  Other Topics Concern   Not on file  Social History Narrative   Caffeine Use:  2-6 cups daily   Regular exercise:  No   4 children, one in [place], 1 in [place], 1 in [place], 1 in [place]   1 grandchildren   Retired from department store.    Social Determinants of Health   Financial Resource Strain: Low Risk  (01/06/2021)   Overall Financial Resource Strain (CARDIA)    Difficulty of Paying Living Expenses: Not hard at all  Food Insecurity: No Food Insecurity (01/06/2021)   Hunger Vital Sign    Worried About Running Out of Food in the Last Year: Never true    Ran Out of Food in the Last Year: Never true  Transportation Needs: No Transportation Needs (01/06/2021)   PRAPARE - Administrator, Civil Service (Medical): No    Lack of Transportation (Non-Medical): No  Physical Activity: Inactive (01/06/2021)  Exercise Vital Sign    Days of Exercise per Week: 0 days    Minutes of Exercise per Session: 0 min  Stress: No Stress Concern Present (01/06/2021)   [employer] of Occupational Health - Occupational Stress Questionnaire    Feeling of Stress : Not at all  Social Connections: Socially Isolated (01/06/2021)   Social Connection and Isolation Panel [NHANES]    Frequency of Communication with Friends and Family: More than three times a week    Frequency of Social Gatherings with Friends and Family: More than three times a week    Attends Religious Services: Never    Database administrator or Organizations: No    Attends Banker Meetings: Never    Marital Status: Divorced  Catering manager Violence: Not At Risk (01/06/2021)   Humiliation, Afraid, Rape, and Kick questionnaire    Fear of Current or Ex-Partner: No    Emotionally Abused: No    Physically  Abused: No    Sexually Abused: No    Outpatient Medications Prior to Visit  Medication Sig Dispense Refill   amLODipine (NORVASC) 5 MG tablet Take 1 tablet (5 mg total) by mouth daily. 90 tablet 1   Calcium Carbonate-Vitamin D 600-400 MG-UNIT tablet Take 1 tablet by mouth 2 (two) times daily. Reported on 07/13/2015     levothyroxine (SYNTHROID) 100 MCG tablet Take 1 tablet by mouth once daily 90 tablet 0   Multiple Vitamins-Minerals (MULTIVITAMIN WITH MINERALS) tablet Take 1 tablet by mouth daily.     Zoster Vaccine Adjuvanted [facility]) injection Inject 0.5mg  IM now and again in 2-6 months. 0.5 mL 1   No facility-administered medications prior to visit.    Allergies  Allergen Reactions   Fosamax [Alendronate Sodium] Nausea And Vomiting    Review of Systems  Gastrointestinal:  Positive for constipation.       Objective:    Physical Exam Constitutional:      Appearance: Normal appearance.  HENT:     Head: Normocephalic and atraumatic.     Right Ear: External ear normal.     Left Ear: External ear normal.  Eyes:     Extraocular Movements: Extraocular movements intact.     Pupils: Pupils are equal, round, and reactive to light.  Neck:     Thyroid: No thyromegaly.  Cardiovascular:     Rate and Rhythm: Normal rate and regular rhythm.     Heart sounds: Normal heart sounds. No murmur heard.    No gallop.  Pulmonary:     Effort: Pulmonary effort is normal. No respiratory distress.     Breath sounds: Normal breath sounds. No wheezing or rales.  Lymphadenopathy:     Cervical: No cervical adenopathy.  Skin:    General: Skin is warm and dry.  Neurological:     Mental Status: She is alert.  Psychiatric:        Mood and Affect: Mood normal.        Behavior: Behavior normal.        Judgment: Judgment normal.     BP (!) 132/56 (BP Location: Right Arm, Patient Position: Sitting, Cuff Size: Small)   Pulse 96   Temp 98.7 F (37.1 C) (Oral)   Resp 16   Wt 120 lb (54.4 kg)    SpO2 99%   BMI 23.44 kg/m  Wt Readings from Last 3 Encounters:  10/07/21 120 lb (54.4 kg)  03/21/21 124 lb 9.6 oz (56.5 kg)  01/06/21 115 lb (52.2 kg)  Assessment & Plan:   Problem List Items Addressed This Visit       Unprioritized   Hypothyroidism    Lab Results  Component Value Date   TSH 0.47 05/06/2021  Currently stable on synthroid. Obtain follow up TSH.       Relevant Orders   TSH   Hyperlipidemia    Plan to check lipid panel next visit.       Essential hypertension    BP Readings from Last 3 Encounters:  10/07/21 (!) 132/56  03/21/21 139/82  08/10/20 114/72  Stable on amlodipine 5mg . Continue same.       Relevant Orders   Basic metabolic panel      No orders of the defined types were placed in this encounter.   I, , NP, personally preformed the services described in this documentation.  All medical record entries made by the scribe were at my direction and in my presence.  I have reviewed the chart and discharge instructions (if applicable) and agree that the record reflects my personal performance and is accurate and complete. 10/07/2021   I,Amber Collins,acting as a scribe for 10/09/2021, NP.,have documented all relevant documentation on the behalf of Lemont Fillers, NP,as directed by  Lemont Fillers, NP while in the presence of Lemont Fillers, NP.    Lemont Fillers, NP

## 2021-10-07 NOTE — Assessment & Plan Note (Addendum)
Lab Results  Component Value Date   TSH 0.47 05/06/2021   Currently stable on synthroid. Obtain follow up TSH.

## 2021-10-07 NOTE — Assessment & Plan Note (Signed)
Plan to check lipid panel next visit.

## 2021-10-10 ENCOUNTER — Telehealth: Payer: Self-pay | Admitting: Family

## 2021-10-10 DIAGNOSIS — E039 Hypothyroidism, unspecified: Secondary | ICD-10-CM

## 2021-10-10 MED ORDER — LEVOTHYROXINE SODIUM 88 MCG PO TABS: 88.0000 ug | ORAL_TABLET | Freq: Every day | ORAL | 1 refills | Status: DC

## 2021-10-10 NOTE — Telephone Encounter (Signed)
Patient advised of results, new rx and scheduled to come in 8/14

## 2021-10-10 NOTE — Telephone Encounter (Signed)
Please advise pt that her lab work shows thyroid medicine is too strong. I would recommend decreasing synthroid to 88 mcg and repeat tsh in 6 weeks.

## 2021-11-07 DIAGNOSIS — L723 Sebaceous cyst: Secondary | ICD-10-CM | POA: Diagnosis not present

## 2021-11-16 NOTE — Telephone Encounter (Signed)
Pt called wanting to know if this appt is necessary as she lives and [place] half away. Advised pt that [name] wanted to check TSH levels to ensure the lower dosage was working ok after 6 weeks. Pt stated she would still like to know if this is necessary.

## 2021-11-16 NOTE — Telephone Encounter (Signed)
Patient advised of why does she need to come in for a repeat TSH, she understands and will keep appointment

## 2021-11-21 ENCOUNTER — Other Ambulatory Visit: Payer: Medicare Other

## 2021-11-24 ENCOUNTER — Other Ambulatory Visit (INDEPENDENT_AMBULATORY_CARE_PROVIDER_SITE_OTHER): Payer: Medicare Other

## 2021-11-24 DIAGNOSIS — E039 Hypothyroidism, unspecified: Secondary | ICD-10-CM

## 2021-11-24 LAB — TSH: TSH: 4.11 u[IU]/mL (ref 0.35–5.50)

## 2021-11-27 ENCOUNTER — Encounter: Payer: Self-pay | Admitting: Family

## 2021-11-28 NOTE — Progress Notes (Signed)
Mailed out to patient 

## 2022-01-24 ENCOUNTER — Ambulatory Visit (INDEPENDENT_AMBULATORY_CARE_PROVIDER_SITE_OTHER): Payer: Medicare Other

## 2022-01-24 DIAGNOSIS — Z0000 Encounter for general adult medical examination without abnormal findings: Secondary | ICD-10-CM | POA: Diagnosis not present

## 2022-01-24 NOTE — Patient Instructions (Signed)
Jill Armstrong , Thank you for taking time to come for your Medicare Wellness Visit. I appreciate your ongoing commitment to your health goals. Please review the following plan we discussed and let me know if I can assist you in the future.   These are the goals we discussed:  Goals      Patient Stated     Increase activity     Patient Stated     Maintain health         This is a list of the screening recommended for you and due dates:  Health Maintenance  Topic Date Due   Zoster (Shingles) Vaccine (1 of 2) Never done   COVID-19 Vaccine (4 - Moderna risk series) 08/12/2020   Flu Shot  11/08/2021   Tetanus Vaccine  07/12/2025   Pneumonia Vaccine  Completed   DEXA scan (bone density measurement)  Completed   Hepatitis C Screening: USPSTF Recommendation to screen - Ages 33-79 yo.  Completed   HPV Vaccine  Aged Out    Advanced directives: Advance directive discussed with you today. Even though you declined this today please call our office should you change your mind and we can give you the proper paperwork for you to fill out.  Conditions/risks identified: maintain health   Next appointment: Follow up in one year for your annual wellness visit    Preventive Care 65 Years and Older, Female Preventive care refers to lifestyle choices and visits with your health care provider that can promote health and wellness. What does preventive care include? A yearly physical exam. This is also called an annual well check. Dental exams once or twice a year. Routine eye exams. Ask your health care provider how often you should have your eyes checked. Personal lifestyle choices, including: Daily care of your teeth and gums. Regular physical activity. Eating a healthy diet. Avoiding tobacco and drug use. Limiting alcohol use. Practicing safe sex. Taking low-dose aspirin every day. Taking vitamin and mineral supplements as recommended by your health care provider. What happens during an  annual well check? The services and screenings done by your health care provider during your annual well check will depend on your age, overall health, lifestyle risk factors, and family history of disease. Counseling  Your health care provider may ask you questions about your: Alcohol use. Tobacco use. Drug use. Emotional well-being. Home and relationship well-being. Sexual activity. Eating habits. History of falls. Memory and ability to understand (cognition). Work and work Statistician. Reproductive health. Screening  You may have the following tests or measurements: Height, weight, and BMI. Blood pressure. Lipid and cholesterol levels. These may be checked every 5 years, or more frequently if you are over 78 years old. Skin check. Lung cancer screening. You may have this screening every year starting at age 66 if you have a 30-pack-year history of smoking and currently smoke or have quit within the past 15 years. Fecal occult blood test (FOBT) of the stool. You may have this test every year starting at age 62. Flexible sigmoidoscopy or colonoscopy. You may have a sigmoidoscopy every 5 years or a colonoscopy every 10 years starting at age 63. Hepatitis C blood test. Hepatitis B blood test. Sexually transmitted disease (STD) testing. Diabetes screening. This is done by checking your blood sugar (glucose) after you have not eaten for a while (fasting). You may have this done every 1-3 years. Bone density scan. This is done to screen for osteoporosis. You may have this done starting at  age 45. Mammogram. This may be done every 1-2 years. Talk to your health care provider about how often you should have regular mammograms. Talk with your health care provider about your test results, treatment options, and if necessary, the need for more tests. Vaccines  Your health care provider may recommend certain vaccines, such as: Influenza vaccine. This is recommended every year. Tetanus,  diphtheria, and acellular pertussis (Tdap, Td) vaccine. You may need a Td booster every 10 years. Zoster vaccine. You may need this after age 25. Pneumococcal 13-valent conjugate (PCV13) vaccine. One dose is recommended after age 8. Pneumococcal polysaccharide (PPSV23) vaccine. One dose is recommended after age 47. Talk to your health care provider about which screenings and vaccines you need and how often you need them. This information is not intended to replace advice given to you by your health care provider. Make sure you discuss any questions you have with your health care provider. Document Released: 04/23/2015 Document Revised: 12/15/2015 Document Reviewed: 01/26/2015 Elsevier Interactive Patient Education  2017 [employer] Prevention in the Home Falls can cause injuries. They can happen to people of all ages. There are many things you can do to make your home safe and to help prevent falls. What can I do on the outside of my home? Regularly fix the edges of walkways and driveways and fix any cracks. Remove anything that might make you trip as you walk through a door, such as a raised step or threshold. Trim any bushes or trees on the path to your home. Use bright outdoor lighting. Clear any walking paths of anything that might make someone trip, such as rocks or tools. Regularly check to see if handrails are loose or broken. Make sure that both sides of any steps have handrails. Any raised decks and porches should have guardrails on the edges. Have any leaves, snow, or ice cleared regularly. Use sand or salt on walking paths during winter. Clean up any spills in your garage right away. This includes oil or grease spills. What can I do in the bathroom? Use night lights. Install grab bars by the toilet and in the tub and shower. Do not use towel bars as grab bars. Use non-skid mats or decals in the tub or shower. If you need to sit down in the shower, use a plastic,  non-slip stool. Keep the floor dry. Clean up any water that spills on the floor as soon as it happens. Remove soap buildup in the tub or shower regularly. Attach bath mats securely with double-sided non-slip rug tape. Do not have throw rugs and other things on the floor that can make you trip. What can I do in the bedroom? Use night lights. Make sure that you have a light by your bed that is easy to reach. Do not use any sheets or blankets that are too big for your bed. They should not hang down onto the floor. Have a firm chair that has side arms. You can use this for support while you get dressed. Do not have throw rugs and other things on the floor that can make you trip. What can I do in the kitchen? Clean up any spills right away. Avoid walking on wet floors. Keep items that you use a lot in easy-to-reach places. If you need to reach something above you, use a strong step stool that has a grab bar. Keep electrical cords out of the way. Do not use floor polish or wax that makes floors  slippery. If you must use wax, use non-skid floor wax. Do not have throw rugs and other things on the floor that can make you trip. What can I do with my stairs? Do not leave any items on the stairs. Make sure that there are handrails on both sides of the stairs and use them. Fix handrails that are broken or loose. Make sure that handrails are as long as the stairways. Check any carpeting to make sure that it is firmly attached to the stairs. Fix any carpet that is loose or worn. Avoid having throw rugs at the top or bottom of the stairs. If you do have throw rugs, attach them to the floor with carpet tape. Make sure that you have a light switch at the top of the stairs and the bottom of the stairs. If you do not have them, ask someone to add them for you. What else can I do to help prevent falls? Wear shoes that: Do not have high heels. Have rubber bottoms. Are comfortable and fit you well. Are closed  at the toe. Do not wear sandals. If you use a stepladder: Make sure that it is fully opened. Do not climb a closed stepladder. Make sure that both sides of the stepladder are locked into place. Ask someone to hold it for you, if possible. Clearly mark and make sure that you can see: Any grab bars or handrails. First and last steps. Where the edge of each step is. Use tools that help you move around (mobility aids) if they are needed. These include: Canes. Walkers. Scooters. Crutches. Turn on the lights when you go into a dark area. Replace any light bulbs as soon as they burn out. Set up your furniture so you have a clear path. Avoid moving your furniture around. If any of your floors are uneven, fix them. If there are any pets around you, be aware of where they are. Review your medicines with your doctor. Some medicines can make you feel dizzy. This can increase your chance of falling. Ask your doctor what other things that you can do to help prevent falls. This information is not intended to replace advice given to you by your health care provider. Make sure you discuss any questions you have with your health care provider. Document Released: 01/21/2009 Document Revised: 09/02/2015 Document Reviewed: 05/01/2014 Elsevier Interactive Patient Education  2017 [employer].

## 2022-01-24 NOTE — Progress Notes (Addendum)
I connected with  Edmonia Lynch on 01/24/22 by a audio enabled telemedicine application and verified that I am speaking with the correct person using two identifiers.  Patient Location: Home  Provider Location: Home Office  I discussed the limitations of evaluation and management by telemedicine. The patient expressed understanding and agreed to proceed.   Subjective:   Jill Armstrong is a 76 y.o. female who presents for Medicare Annual (Subsequent) preventive examination.  Review of Systems     Cardiac Risk Factors include: advanced age (>95men, >63 women);dyslipidemia;hypertension;smoking/ tobacco exposure     Objective:    There were no vitals filed for this visit. There is no height or weight on file to calculate BMI.     01/24/2022   12:56 PM 01/06/2021   11:09 AM 12/30/2018    1:14 PM 12/24/2017   10:22 AM 12/18/2016   10:07 AM 02/23/2015    9:54 AM  Advanced Directives  Does Patient Have a Medical Advance Directive? No No No No No No  Would patient like information on creating a medical advance directive? No - Patient declined Yes (MAU/Ambulatory/Procedural Areas - Information given) No - Patient declined Yes (MAU/Ambulatory/Procedural Areas - Information given) Yes (MAU/Ambulatory/Procedural Areas - Information given) Yes - Educational materials given    Current Medications (verified) Outpatient Encounter Medications as of 01/24/2022  Medication Sig   amLODipine (NORVASC) 5 MG tablet Take 1 tablet (5 mg total) by mouth daily.   Calcium Carbonate-Vitamin D 600-400 MG-UNIT tablet Take 1 tablet by mouth 2 (two) times daily. Reported on 07/13/2015   levothyroxine (SYNTHROID) 88 MCG tablet Take 1 tablet (88 mcg total) by mouth daily.   Multiple Vitamins-Minerals (MULTIVITAMIN WITH MINERALS) tablet Take 1 tablet by mouth daily.   Zoster Vaccine Adjuvanted [facility]) injection Inject 0.5mg  IM now and again in 2-6 months.   No facility-administered encounter medications on  file as of 01/24/2022.    Allergies (verified) Fosamax [alendronate sodium]   History: Past Medical History:  Diagnosis Date   Arthritis    osteoarthritis right hip   Osteopenia    Thyroid disease    hypothyroidism   Tobacco abuse    Past Surgical History:  Procedure Laterality Date   EYE SURGERY     TONSILLECTOMY AND ADENOIDECTOMY     TOTAL HIP ARTHROPLASTY  04/10/2005   right hip   TUBAL LIGATION     Family History  Adopted: Yes  Family history unknown: Yes   Social History   Socioeconomic History   Marital status: Divorced    Spouse name: Not on file   Number of children: 4   Years of education: Not on file   Highest education level: Not on file  Occupational History   Not on file  Tobacco Use   Smoking status: Every Day    Packs/day: 1.50    Years: 45.00    Total pack years: 67.50    Types: Cigarettes   Smokeless tobacco: Never  Substance and Sexual Activity   Alcohol use: Yes    Comment: couple of times per week   Drug use: No   Sexual activity: Never  Other Topics Concern   Not on file  Social History Narrative   Caffeine Use:  2-6 cups daily   Regular exercise:  No   4 children, one in [place], 1 in [place], 1 in [place], 1 in [place]   1 grandchildren   Retired from department store.    Social Determinants of Health  Financial Resource Strain: Low Risk  (01/24/2022)   Overall Financial Resource Strain (CARDIA)    Difficulty of Paying Living Expenses: Not hard at all  Food Insecurity: No Food Insecurity (01/24/2022)   Hunger Vital Sign    Worried About Running Out of Food in the Last Year: Never true    Ran Out of Food in the Last Year: Never true  Transportation Needs: No Transportation Needs (01/24/2022)   PRAPARE - Hydrologist (Medical): No    Lack of Transportation (Non-Medical): No  Physical Activity: Inactive (01/24/2022)   Exercise Vital Sign    Days of Exercise per Week: 0 days    Minutes of  Exercise per Session: 0 min  Stress: No Stress Concern Present (01/24/2022)   [place]    Feeling of Stress : Not at all  Social Connections: Socially Isolated (01/24/2022)   Social Connection and Isolation Panel [NHANES]    Frequency of Communication with Friends and Family: More than three times a week    Frequency of Social Gatherings with Friends and Family: More than three times a week    Attends Religious Services: Never    Marine scientist or Organizations: No    Attends Music therapist: Never    Marital Status: Divorced    Tobacco Counseling Ready to quit: Not Answered Counseling given: Not Answered   Clinical Intake:  Pre-visit preparation completed: Yes  Pain : No/denies pain     Nutritional Risks: None Diabetes: No  How often do you need to have someone help you when you read instructions, pamphlets, or other written materials from your doctor or pharmacy?: 1 - Never  Diabetic?no  Interpreter Needed?: No  Information entered by :: Charlott Rakes, LPN   Activities of Daily Living    01/24/2022   12:58 PM  In your present state of health, do you have any difficulty performing the following activities:  Hearing? 0  Vision? 0  Difficulty concentrating or making decisions? 0  Walking or climbing stairs? 0  Dressing or bathing? 0  Doing errands, shopping? 0  Preparing Food and eating ? N  Using the Toilet? N  In the past six months, have you accidently leaked urine? N  Do you have problems with loss of bowel control? N  Managing your Medications? N  Managing your Finances? N  Housekeeping or managing your Housekeeping? N    Patient Care Team: Debbrah Alar, NP as PCP - General (Internal Medicine)  Indicate any recent Medical Services you may have received from other than [facility] providers in the past year (date may be approximate).     Assessment:   This is  a routine wellness examination for [facility].  Hearing/Vision screen Hearing Screening - Comments:: Pt denies any hearing issues  Vision Screening - Comments:: Pt unsure of provider   Dietary issues and exercise activities discussed: Current Exercise Habits: The patient does not participate in regular exercise at present   Goals Addressed             This Visit's Progress    Patient Stated       Maintain health        Depression Screen    01/24/2022   12:56 PM 01/06/2021   11:12 AM 08/10/2020   11:01 AM 02/11/2020   11:32 AM 12/30/2018    1:15 PM 12/24/2017   10:22 AM 12/18/2016   10:08 AM  PHQ  2/9 Scores  PHQ - 2 Score 0 0 0 0 0 0 0    Fall Risk    01/24/2022   12:58 PM 01/06/2021   11:11 AM 08/10/2020   11:01 AM 02/11/2020   11:32 AM 12/30/2018    1:15 PM  Fall Risk   Falls in the past year? 0 0 0 0 0  Number falls in past yr: 0 0 0 0   Injury with Fall? 0 0 0 0   Risk for fall due to : No Fall Risks;Impaired vision      Follow up Falls prevention discussed Falls prevention discussed       FALL RISK PREVENTION PERTAINING TO THE HOME:  Any stairs in or around the home? Yes  If so, are there any without handrails? No  Home free of loose throw rugs in walkways, pet beds, electrical cords, etc? Yes  Adequate lighting in your home to reduce risk of falls? Yes   ASSISTIVE DEVICES UTILIZED TO PREVENT FALLS:  Life alert? No  Use of a cane, walker or w/c? No  Grab bars in the bathroom? No  Shower chair or bench in shower? No  Elevated toilet seat or a handicapped toilet? No   TIMED UP AND GO:  Was the test performed? No .   Cognitive Function: pt was unable to complete due to telephonic issues with her line. Pt was alert and oriented through the conversation.     12/18/2016   10:09 AM 02/23/2015   10:12 AM  MMSE - Mini Mental State Exam  Orientation to time 5 5  Orientation to Place 5 5  Registration 3 3  Attention/ Calculation 5 5  Recall 3 3  Language-  name 2 objects 2 2  Language- repeat 1 1  Language- follow 3 step command 3 3  Language- read & follow direction 1 1  Write a sentence 1 1  Copy design 1 1  Total score 30 30        Immunizations Immunization History  Administered Date(s) Administered   Fluad Quad(high Dose 65+) 02/11/2020, 03/21/2021   Influenza Split 02/27/2012   Influenza, High Dose Seasonal PF 04/22/2013, 01/26/2015, 01/19/2016, 01/23/2017   Moderna Sars-Covid-2 Vaccination 05/15/2019, 06/11/2019, 06/17/2020   Pneumococcal Conjugate-13 02/23/2015   Pneumococcal Polysaccharide-23 03/29/2007, 04/22/2013   Td 06/20/2005, 07/13/2015    TDAP status: Up to date  Flu Vaccine status: Due, Education has been provided regarding the importance of this vaccine. Advised may receive this vaccine at local pharmacy or Health Dept. Aware to provide a copy of the vaccination record if obtained from local pharmacy or Health Dept. Verbalized acceptance and understanding.  Pneumococcal vaccine status: Up to date  Covid-19 vaccine status: Completed vaccines  Qualifies for Shingles Vaccine? Yes   Zostavax completed No   Shingrix Completed?: No.    Education has been provided regarding the importance of this vaccine. Patient has been advised to call insurance company to determine out of pocket expense if they have not yet received this vaccine. Advised may also receive vaccine at local pharmacy or Health Dept. Verbalized acceptance and understanding.  Screening Tests Health Maintenance  Topic Date Due   Zoster Vaccines- Shingrix (1 of 2) Never done   COVID-19 Vaccine (4 - Moderna risk series) 08/12/2020   INFLUENZA VACCINE  11/08/2021   TETANUS/TDAP  07/12/2025   Pneumonia Vaccine 73+ Years old  Completed   DEXA SCAN  Completed   Hepatitis C Screening  Completed   HPV VACCINES  Aged Out    Health Maintenance  Health Maintenance Due  Topic Date Due   Zoster Vaccines- Shingrix (1 of 2) Never done   COVID-19 Vaccine (4  - Moderna risk series) 08/12/2020   INFLUENZA VACCINE  11/08/2021    Colorectal cancer screening: No longer required.   Mammogram status: Completed 04/20/21. Repeat every year  Bone Density status: Completed 08/19/20. Results reflect: Bone density results: OSTEOPOROSIS. Repeat every 2 years.  Lung Cancer Screening: (Low Dose CT Chest recommended if Age 15-80 years, 30 pack-year currently smoking OR have quit w/in 15years.) does qualify.   Lung Cancer Screening Referral: pt declined   Additional Screening:  Hepatitis C Screening: Completed 03/21/21  Vision Screening: Recommended annual ophthalmology exams for early detection of glaucoma and other disorders of the eye. Is the patient up to date with their annual eye exam?  No  Who is the provider or what is the name of the office in which the patient attends annual eye exams? Unsure of provider  If pt is not established with a provider, would they like to be referred to a provider to establish care? No .   Dental Screening: Recommended annual dental exams for proper oral hygiene  Community Resource Referral / Chronic Care Management: CRR required this visit?  No   CCM required this visit?  No      Plan:     I have personally reviewed and noted the following in the patient's chart:   Medical and social history Use of alcohol, tobacco or illicit drugs  Current medications and supplements including opioid prescriptions. Patient is not currently taking opioid prescriptions. Functional ability and status Nutritional status Physical activity Advanced directives List of other physicians Hospitalizations, surgeries, and ER visits in previous 12 months Vitals Screenings to include cognitive, depression, and falls Referrals and appointments  In addition, I have reviewed and discussed with patient certain preventive protocols, quality metrics, and best practice recommendations. A written personalized care plan for preventive  services as well as general preventive health recommendations were provided to patient.     Willette Brace, LPN   [id number]   Nurse Notes: none

## 2022-02-23 ENCOUNTER — Telehealth: Payer: Self-pay | Admitting: Family

## 2022-02-23 NOTE — Telephone Encounter (Signed)
Pt's daughter called in and stated that she is very concerned about her mom's wellbeing. She stated her mom's mood has changed a lot over the last 64m, as well as memory issues. She tends to get very defensive or "acting like a teenager" when being asked if she's eaten today, as she cannot recall meals,and has lost some weight. She would like to know if pcp could give Marcelino Duster a call to discuss this, and maybe a game plan on how to discuss this with her mom, before anything is discussed with the pt. She stated her mom is going to be very mad at her for telling her dr and it might results in a fight so she would like to go about this carefully. Marcelino Duster is on dpr.

## 2022-02-27 NOTE — Telephone Encounter (Signed)
I spoke with her daughter Marcelino Duster.  Marcelino Duster states that pt is forgetting to eat.  States pt is an alcoholic and does not disclose that.  Has been short with family members. Lives on a property that [place] and well is broken so they are bringing her water and landlord is working on Transport planner.  She also notes that patient's face looked "really puffy" the last time she saw her.  It looks like pt is due for follow up. Will arrange follow up appointment for further evaluation.   Windell Moulding- please contact pt and schedule a follow up visit. Daughter requests that we not tell the patient that she called [place].

## 2022-02-27 NOTE — Telephone Encounter (Signed)
Patient scheduled for 03/07/22 

## 2022-02-27 NOTE — Telephone Encounter (Signed)
Pt called back, was not sure if I was able to schedule or was waiting. Please follow up with pt.

## 2022-03-07 ENCOUNTER — Ambulatory Visit: Payer: Medicare Other | Admitting: Family

## 2022-04-18 ENCOUNTER — Telehealth: Payer: Self-pay | Admitting: Family

## 2022-04-18 DIAGNOSIS — R413 Other amnesia: Secondary | ICD-10-CM

## 2022-04-18 NOTE — Telephone Encounter (Signed)
Pt's daughter was requesting to talk with pcp before pt's appt on 1/23. Stated she think she is having some memory issues.

## 2022-04-19 NOTE — Telephone Encounter (Signed)
I tried both daughter's phone numbers. No answer for [place], busy signal for [place].

## 2022-04-20 ENCOUNTER — Telehealth: Payer: Self-pay | Admitting: Family

## 2022-04-20 ENCOUNTER — Telehealth (HOSPITAL_BASED_OUTPATIENT_CLINIC_OR_DEPARTMENT_OTHER): Payer: Self-pay

## 2022-04-20 NOTE — Telephone Encounter (Signed)
error 

## 2022-04-20 NOTE — Telephone Encounter (Signed)
Daughter Sharyn Lull called back because she had not heard from provider and would like to speak with provider before her mother's visit. Advised provider did attempt a call yesterday but got a busy signal. Callback # [phone].

## 2022-04-21 NOTE — Telephone Encounter (Signed)
Spoke with Sharyn Lull. She states that patient is continuing to have memory problems and she is very concerned. She will come with her to her appointment on 1/23. We discussed doing a formal memory evaluation at that visit.

## 2022-05-01 NOTE — Progress Notes (Addendum)
Subjective:   By signing my name below, I, Jill Armstrong, attest that this documentation has been prepared under the direction and in the presence of Jill Pear, NP 05/02/2022   Patient ID: Jill Armstrong, female    DOB: 09-02-1945, 77 y.o.   MRN: [id number]  Chief Complaint  Patient presents with   Hypertension    Here for follow up   Hypothyroidism    Here for follow up    HPI Patient is in today for follow up visit.   Blood pressure management: Her blood pressure is 127/83. She is compliant with Amlodipine 5 mg daily  Hypothyroidism management: She is compliant with Synthroid 88 mcg daily.   Memory: Patient forgot her last visit. Her daughter reports that she has become very forgetful. She is worried about her memory.   Health Maintenance: She reports she has been smoking on average about 3/4 of a pack a day, one every hour while awake.   Immunizations: She has not received her shingles vaccine. She is open to receiving the flu vaccine today.   Past Medical History:  Diagnosis Date   Arthritis    osteoarthritis right hip   Osteopenia    Thyroid disease    hypothyroidism   Tobacco abuse     Past Surgical History:  Procedure Laterality Date   EYE SURGERY     TONSILLECTOMY AND ADENOIDECTOMY     TOTAL HIP ARTHROPLASTY  04/10/2005   right hip   TUBAL LIGATION      Family History  Adopted: Yes  Family history unknown: Yes    Social History   Socioeconomic History   Marital status: Divorced    Spouse name: Not on file   Number of children: 4   Years of education: Not on file   Highest education level: Not on file  Occupational History   Not on file  Tobacco Use   Smoking status: Every Day    Packs/day: 0.75    Years: 45.00    Total pack years: 33.75    Types: Cigarettes   Smokeless tobacco: Never  Substance and Sexual Activity   Alcohol use: Yes    Comment: couple of times per week   Drug use: No   Sexual activity: Never  Other  Topics Concern   Not on file  Social History Narrative   Caffeine Use:  2-6 cups daily   Regular exercise:  No   4 children, one in [place], 1 in [place], 1 in [place], 1 in [place]   1 grandchildren   Retired from department store.    Social Determinants of Health   Financial Resource Strain: Low Risk  (01/24/2022)   Overall Financial Resource Strain (CARDIA)    Difficulty of Paying Living Expenses: Not hard at all  Food Insecurity: No Food Insecurity (01/24/2022)   Hunger Vital Sign    Worried About Running Out of Food in the Last Year: Never true    Ran Out of Food in the Last Year: Never true  Transportation Needs: No Transportation Needs (01/24/2022)   PRAPARE - Hydrologist (Medical): No    Lack of Transportation (Non-Medical): No  Physical Activity: Inactive (01/24/2022)   Exercise Vital Sign    Days of Exercise per Week: 0 days    Minutes of Exercise per Session: 0 min  Stress: No Stress Concern Present (01/24/2022)   [place]    Feeling of  Stress : Not at all  Social Connections: Socially Isolated (01/24/2022)   Social Connection and Isolation Panel [NHANES]    Frequency of Communication with Friends and Family: More than three times a week    Frequency of Social Gatherings with Friends and Family: More than three times a week    Attends Religious Services: Never    Database administrator or Organizations: No    Attends Banker Meetings: Never    Marital Status: Divorced  Catering manager Violence: Not At Risk (01/24/2022)   Humiliation, Afraid, Rape, and Kick questionnaire    Fear of Current or Ex-Partner: No    Emotionally Abused: No    Physically Abused: No    Sexually Abused: No    Outpatient Medications Prior to Visit  Medication Sig Dispense Refill   amLODipine (NORVASC) 5 MG tablet Take 1 tablet (5 mg total) by mouth daily. 90 tablet 1   Calcium  Carbonate-Vitamin D 600-400 MG-UNIT tablet Take 1 tablet by mouth 2 (two) times daily. Reported on 07/13/2015     levothyroxine (SYNTHROID) 88 MCG tablet Take 1 tablet (88 mcg total) by mouth daily. 90 tablet 1   Multiple Vitamins-Minerals (MULTIVITAMIN WITH MINERALS) tablet Take 1 tablet by mouth daily.     Zoster Vaccine Adjuvanted [facility]) injection Inject 0.5mg  IM now and again in 2-6 months. 0.5 mL 1   No facility-administered medications prior to visit.    Allergies  Allergen Reactions   Fosamax [Alendronate Sodium] Nausea And Vomiting    Review of Systems  Constitutional:  Negative for fever.       (-)unexpected weight change (-)Adenopathy  HENT:  Negative for congestion, sinus pain and sore throat.   Eyes:        (-)Visual disturbance  Respiratory:  Negative for cough, shortness of breath and wheezing.   Cardiovascular:  Negative for chest pain, palpitations and leg swelling.  Gastrointestinal:  Negative for blood in stool, constipation, diarrhea, nausea and vomiting.  Genitourinary:  Negative for dysuria, frequency and hematuria.  Musculoskeletal:        (-)new muscle pain (-)new joint pain  Skin:        (-)new moles  Neurological:  Negative for dizziness and headaches.  Psychiatric/Behavioral:  Negative for depression. The patient is not nervous/anxious.        Objective:    Physical Exam Constitutional:      General: She is awake. She is not in acute distress.    Appearance: Normal appearance. She is not ill-appearing.  HENT:     Head: Normocephalic and atraumatic.     Right Ear: External ear normal.     Left Ear: External ear normal.  Eyes:     Extraocular Movements: Extraocular movements intact.     Pupils: Pupils are equal, round, and reactive to light.  Cardiovascular:     Rate and Rhythm: Normal rate and regular rhythm.     Heart sounds: Normal heart sounds. No murmur heard.    No gallop.  Pulmonary:     Effort: Pulmonary effort is normal. No  respiratory distress.     Breath sounds: Normal breath sounds. No wheezing or rales.  Skin:    General: Skin is warm and dry.  Neurological:     Mental Status: She is alert.     Cranial Nerves: No facial asymmetry.     Comments: Oriented to person and place, but not to time.  Psychiatric:        Mood and  Affect: Mood normal.        Speech: Speech normal.        Cognition and Memory: Memory is impaired (oriented to person and place, not to time.).        Judgment: Judgment normal.     BP 127/83 (BP Location: Right Arm, Patient Position: Sitting, Cuff Size: Small)   Pulse 97   Temp 98.3 F (36.8 C) (Oral)   Resp 16   Wt 118 lb (53.5 kg)   SpO2 100%   BMI 23.05 kg/m  Wt Readings from Last 3 Encounters:  05/02/22 118 lb (53.5 kg)  10/07/21 120 lb (54.4 kg)  03/21/21 124 lb 9.6 oz (56.5 kg)       Assessment & Plan:   Problem List Items Addressed This Visit       Unprioritized   TOBACCO ABUSE   Relevant Orders   CT CHEST LUNG CA SCREEN LOW DOSE W/O CM   Memory loss    New.  Patient completed MMSE today.  Will obtain labs as ordered and refer to [facility] center in [place] which is closer to her home.  Daughter Sharyn Lull plans to take her to this visit.      05/02/2022   11:25 AM 05/02/2022   11:23 AM 12/18/2016   10:09 AM  MMSE - Mini Mental State Exam  Orientation to time  2 5  Orientation to Place 3 2 5   Registration  3 3  Attention/ Calculation  3 5  Recall  2 3  Language- name 2 objects  2 2  Language- repeat  1 1  Language- follow 3 step command  1 3  Language- read & follow direction  1 1  Write a sentence  1 1  Copy design  1 1  Total score  19 30          Relevant Orders   B12   RPR   CT CHEST LUNG CA SCREEN LOW DOSE W/O CM   Ambulatory referral to Neurology   Hypothyroidism    Clinically stable on synthroid.       Relevant Orders   TSH   Hyperlipidemia    Not on statin.  Lab Results  Component Value Date   CHOL 237 (H) 02/11/2020    HDL 117 02/11/2020   LDLCALC 102 (H) 02/11/2020   TRIG 89 02/11/2020   CHOLHDL 2.0 02/11/2020        Relevant Orders   Lipid panel   Essential hypertension - Primary    BP Readings from Last 3 Encounters:  05/02/22 127/83  10/07/21 (!) 132/56  03/21/21 139/82  Stable on amlodipine 5mg  once daily.       Flu shot today.   I, Jill Pear, NP, personally preformed the services described in this documentation.  All medical record entries made by the scribe were at my direction and in my presence.  I have reviewed the chart and discharge instructions (if applicable) and agree that the record reflects my personal performance and is accurate and complete. 05/02/2022  I,Rachel Rivera,acting as a scribe for Jill Pear, NP.,have documented all relevant documentation on the behalf of Jill Pear, NP,as directed by  Jill Pear, NP while in the presence of Jill Pear, NP.   Jill Pear, NP

## 2022-05-02 ENCOUNTER — Ambulatory Visit (INDEPENDENT_AMBULATORY_CARE_PROVIDER_SITE_OTHER): Payer: Medicare Other | Admitting: Family

## 2022-05-02 ENCOUNTER — Encounter: Payer: Self-pay | Admitting: Family

## 2022-05-02 VITALS — BP 127/83 | HR 97 | Temp 98.3°F | Resp 16 | Wt 118.0 lb

## 2022-05-02 DIAGNOSIS — E039 Hypothyroidism, unspecified: Secondary | ICD-10-CM

## 2022-05-02 DIAGNOSIS — E785 Hyperlipidemia, unspecified: Secondary | ICD-10-CM

## 2022-05-02 DIAGNOSIS — R413 Other amnesia: Secondary | ICD-10-CM

## 2022-05-02 DIAGNOSIS — I10 Essential (primary) hypertension: Secondary | ICD-10-CM | POA: Diagnosis not present

## 2022-05-02 DIAGNOSIS — F17200 Nicotine dependence, unspecified, uncomplicated: Secondary | ICD-10-CM | POA: Diagnosis not present

## 2022-05-02 DIAGNOSIS — Z23 Encounter for immunization: Secondary | ICD-10-CM

## 2022-05-02 LAB — TSH: TSH: 12.09 u[IU]/mL — ABNORMAL HIGH (ref 0.35–5.50)

## 2022-05-02 LAB — LIPID PANEL
Cholesterol: 230 mg/dL — ABNORMAL HIGH (ref 0–200)
HDL: 112.9 mg/dL (ref >39.00)
LDL Cholesterol: 101 mg/dL — ABNORMAL HIGH (ref 0–99)
NonHDL: 117.01
Total CHOL/HDL Ratio: 2
Triglycerides: 81 mg/dL (ref 0.0–149.0)
VLDL: 16.2 mg/dL (ref 0.0–40.0)

## 2022-05-02 LAB — VITAMIN B12: Vitamin B-12: 267 pg/mL (ref 211–911)

## 2022-05-02 NOTE — Addendum Note (Signed)
Addended by: Jiles Prows on: 05/02/2022 02:45 PM   Modules accepted: Orders

## 2022-05-02 NOTE — Assessment & Plan Note (Addendum)
New.  Patient completed MMSE today.  Will obtain labs as ordered and refer to [facility] center in [place] which is closer to her home.  Daughter Sharyn Lull plans to take her to this visit.      05/02/2022   11:25 AM 05/02/2022   11:23 AM 12/18/2016   10:09 AM  MMSE - Mini Mental State Exam  Orientation to time  2 5  Orientation to Place 3 2 5   Registration  3 3  Attention/ Calculation  3 5  Recall  2 3  Language- name 2 objects  2 2  Language- repeat  1 1  Language- follow 3 step command  1 3  Language- read & follow direction  1 1  Write a sentence  1 1  Copy design  1 1  Total score  19 30

## 2022-05-02 NOTE — Assessment & Plan Note (Signed)
Clinically stable on synthroid 

## 2022-05-02 NOTE — Assessment & Plan Note (Signed)
Lab Results  Component Value Date   HGBA1C 5.4 08/25/2020

## 2022-05-02 NOTE — Assessment & Plan Note (Signed)
Not on statin.  Lab Results  Component Value Date   CHOL 237 (H) 02/11/2020   HDL 117 02/11/2020   LDLCALC 102 (H) 02/11/2020   TRIG 89 02/11/2020   CHOLHDL 2.0 02/11/2020

## 2022-05-02 NOTE — Assessment & Plan Note (Signed)
BP Readings from Last 3 Encounters:  05/02/22 127/83  10/07/21 (!) 132/56  03/21/21 139/82   Stable on amlodipine 5mg  once daily.

## 2022-05-03 LAB — RPR: RPR Ser Ql: NONREACTIVE

## 2022-05-04 ENCOUNTER — Other Ambulatory Visit: Payer: Self-pay | Admitting: Family

## 2022-05-04 NOTE — Telephone Encounter (Signed)
B12 is low normal.  Add b12 1086mcg po once daily.  It looks like we need to increase synthroid dose.  Has she been taking regularly?  If so, we should  increase synthroid to 100 mcg and have her repeat tsh in 6 weeks.   Rx pended.

## 2022-05-05 ENCOUNTER — Telehealth: Payer: Self-pay | Admitting: Family

## 2022-05-05 ENCOUNTER — Other Ambulatory Visit: Payer: Self-pay

## 2022-05-05 DIAGNOSIS — E039 Hypothyroidism, unspecified: Secondary | ICD-10-CM

## 2022-05-05 MED ORDER — VITAMIN B-12 1000 MCG PO TABS: 1000.0000 ug | ORAL_TABLET | Freq: Every day | ORAL | Status: AC

## 2022-05-05 MED ORDER — LEVOTHYROXINE SODIUM 100 MCG PO TABS: 100.0000 ug | ORAL_TABLET | Freq: Every day | ORAL | 0 refills | Status: DC

## 2022-05-05 NOTE — Telephone Encounter (Signed)
Prescription Request  05/05/2022  Is this a "Controlled Substance" medicine? No  LOV: 05/02/2022  What is the name of the medication or equipment?  amLODipine (NORVASC) 5 MG tablet [[id number]]   levothyroxine (SYNTHROID) 88 MCG tablet [[id number]]  DISCONTINUED   Have you contacted your pharmacy to request a refill? No   Which pharmacy would you like this sent to?  [place], [place] - [address] [place] [address] Phone: [phone] Fax: [phone]     Patient notified that their request is being sent to the clinical staff for review and that they should receive a response within 2 business days.   Please advise at Mobile [phone] (mobile)

## 2022-05-05 NOTE — Telephone Encounter (Signed)
Patient's daughter, Sharyn Lull, called to follow up on referral for memory evaluation. The only referral I see is for the lung CT. Please call her at [phone] to advise.

## 2022-05-07 ENCOUNTER — Other Ambulatory Visit: Payer: Self-pay | Admitting: Family

## 2022-05-07 NOTE — Telephone Encounter (Signed)
Please advise daughter that the referral has been placed.  I thought I placed it on the day of her visit but apparently I did not- my apologies.

## 2022-05-07 NOTE — Addendum Note (Signed)
Addended by: Debbrah Alar on: 05/07/2022 12:33 PM   Modules accepted: Orders

## 2022-05-08 NOTE — Telephone Encounter (Signed)
Synthroid increased to 100mg  and sent 05/05/22 amlodipine sent yesterday

## 2022-05-08 NOTE — Telephone Encounter (Signed)
Patient's daughter advised referral was entered

## 2022-05-09 ENCOUNTER — Encounter: Payer: Self-pay | Admitting: Family

## 2022-05-12 ENCOUNTER — Telehealth: Payer: Self-pay | Admitting: Family

## 2022-05-12 DIAGNOSIS — R413 Other amnesia: Secondary | ICD-10-CM

## 2022-05-12 NOTE — Telephone Encounter (Signed)
Pt's daughter states that the referral is not supposed to go through neurology but through [facility]. She stated [facility]rium told her the order was sent through neurology. And that they are very booked up and her mom cannot wait, she is very concerned for her wellbeing. She asked if there was anything we could do to expedite this. Please advise.

## 2022-05-12 NOTE — Telephone Encounter (Signed)
Please advise daughter Sharyn Lull and advise her that we will refax her mom's referral to  [facility].  I may be able to get her in sooner with [facility] for her memory- would she like me to also try that to see where she could get in first.

## 2022-05-15 NOTE — Telephone Encounter (Signed)
Referral placed.

## 2022-05-15 NOTE — Telephone Encounter (Signed)
Patient's daughter will like referral to be sent to both locations to see who can get her is faster

## 2022-06-01 ENCOUNTER — Ambulatory Visit (HOSPITAL_BASED_OUTPATIENT_CLINIC_OR_DEPARTMENT_OTHER)
Admission: RE | Admit: 2022-06-01 | Discharge: 2022-06-01 | Disposition: A | Discharge status: Home / Self Care | Payer: Medicare Other | Source: Ambulatory Visit | Attending: Family | Admitting: Family

## 2022-06-01 DIAGNOSIS — R918 Other nonspecific abnormal finding of lung field: Secondary | ICD-10-CM | POA: Insufficient documentation

## 2022-06-01 DIAGNOSIS — I2510 Atherosclerotic heart disease of native coronary artery without angina pectoris: Secondary | ICD-10-CM | POA: Insufficient documentation

## 2022-06-01 DIAGNOSIS — J432 Centrilobular emphysema: Secondary | ICD-10-CM | POA: Diagnosis not present

## 2022-06-01 DIAGNOSIS — J439 Emphysema, unspecified: Secondary | ICD-10-CM | POA: Insufficient documentation

## 2022-06-01 DIAGNOSIS — J9809 Other diseases of bronchus, not elsewhere classified: Secondary | ICD-10-CM | POA: Diagnosis not present

## 2022-06-01 DIAGNOSIS — K760 Fatty (change of) liver, not elsewhere classified: Secondary | ICD-10-CM | POA: Diagnosis not present

## 2022-06-01 DIAGNOSIS — Z122 Encounter for screening for malignant neoplasm of respiratory organs: Secondary | ICD-10-CM | POA: Insufficient documentation

## 2022-06-01 DIAGNOSIS — R413 Other amnesia: Secondary | ICD-10-CM | POA: Diagnosis present

## 2022-06-01 DIAGNOSIS — F17210 Nicotine dependence, cigarettes, uncomplicated: Secondary | ICD-10-CM | POA: Insufficient documentation

## 2022-06-01 DIAGNOSIS — I700 Atherosclerosis of aorta: Secondary | ICD-10-CM | POA: Diagnosis not present

## 2022-06-01 DIAGNOSIS — F17200 Nicotine dependence, unspecified, uncomplicated: Secondary | ICD-10-CM

## 2022-06-02 ENCOUNTER — Other Ambulatory Visit (HOSPITAL_BASED_OUTPATIENT_CLINIC_OR_DEPARTMENT_OTHER): Payer: Self-pay | Admitting: Family

## 2022-06-02 DIAGNOSIS — Z1231 Encounter for screening mammogram for malignant neoplasm of breast: Secondary | ICD-10-CM

## 2022-06-04 ENCOUNTER — Telehealth: Payer: Self-pay | Admitting: Family

## 2022-06-04 DIAGNOSIS — E785 Hyperlipidemia, unspecified: Secondary | ICD-10-CM

## 2022-06-04 MED ORDER — ATORVASTATIN CALCIUM 10 MG PO TABS: 10.0000 mg | ORAL_TABLET | Freq: Every day | ORAL | 3 refills | Status: AC

## 2022-06-04 NOTE — Telephone Encounter (Addendum)
Please advise pt that I reviewed her lung cancer screening CT.  It does not show any sign of lung cancer, however it does show some calcifications of her coronary arteries.  I would recommend that she add atorvastatin '10mg'$  once daily for cholesterol and most importantly quit smoking. I would also like to refer her to cardiology for further evaluation.   Note is also made of fatty liver on CT. Low fat/low cholesterol diet, exercise is best treatment for fatty liver.

## 2022-06-05 NOTE — Telephone Encounter (Signed)
Patient notified of results, referral and provider's advise. She verbalized understanding

## 2022-06-06 NOTE — Telephone Encounter (Signed)
Pt does not remember the recommendations and she would like a nurse to go over them again.

## 2022-06-07 DIAGNOSIS — H903 Sensorineural hearing loss, bilateral: Secondary | ICD-10-CM | POA: Diagnosis not present

## 2022-06-07 NOTE — Telephone Encounter (Signed)
Called patient and results explained to her again

## 2022-06-19 ENCOUNTER — Other Ambulatory Visit: Payer: Medicare Other

## 2022-06-19 ENCOUNTER — Inpatient Hospital Stay (HOSPITAL_BASED_OUTPATIENT_CLINIC_OR_DEPARTMENT_OTHER): Admission: RE | Admit: 2022-06-19 | Payer: Medicare Other | Source: Ambulatory Visit

## 2022-06-30 ENCOUNTER — Telehealth: Payer: Self-pay | Admitting: Family

## 2022-06-30 DIAGNOSIS — R413 Other amnesia: Secondary | ICD-10-CM

## 2022-06-30 NOTE — Addendum Note (Signed)
Addended by: Debbrah Alar on: 06/30/2022 05:10 PM   Modules accepted: Orders

## 2022-06-30 NOTE — Telephone Encounter (Signed)
Daughter, Sharyn Lull, called to see what the lab results were from January. She asked her mom and her mom said "everything is fine." Sharyn Lull would like to speak with provider. [phone]

## 2022-07-02 NOTE — Telephone Encounter (Signed)
Late entry- Spoke to [name] on 06/30/22.  We reviewed that her TSH was elevated and we adjusted her synthroid but she missed her follow up lab appointment. She is not sure if she made this adjustment.  She will check and plan to bring her in at her next scheduled follow up.  She is also inquiring about what she would need to do to qualify for medicaid.  I advised her that I would refer her to social work to evaluate this.

## 2022-07-02 NOTE — Addendum Note (Signed)
Addended by: Debbrah Alar on: 07/02/2022 06:36 PM   Modules accepted: Orders

## 2022-07-03 ENCOUNTER — Telehealth: Payer: Self-pay | Admitting: *Deleted

## 2022-07-03 NOTE — Progress Notes (Signed)
  Care Coordination   Note   07/03/2022 Name: Jill Armstrong MRN: [id number] DOB: 03/11/1946  Jill Armstrong is a 77 y.o. year old female who sees Debbrah Alar, NP for primary care. I reached out to Edmonia Lynch by phone today to offer care coordination services.  [name] was given information about Care Coordination services today including:   The Care Coordination services include support from the care team which includes your Nurse Coordinator, Clinical Social Worker, or Pharmacist.  The Care Coordination team is here to help remove barriers to the health concerns and goals most important to you. Care Coordination services are voluntary, and the patient may decline or stop services at any time by request to their care team member.   Care Coordination Consent Status: Patient agreed to services and verbal consent obtained.   Follow up plan:  Telephone appointment with care coordination team member scheduled for:  07/06/2022  Encounter Outcome:  Pt. Scheduled from referral   Julian Hy, [place] Direct Dial: [phone]

## 2022-07-03 NOTE — Progress Notes (Signed)
  Care Coordination  Outreach Note  07/03/2022 Name: Jill Armstrong MRN: [id number] DOB: 10/31/1945   Care Coordination Outreach Attempts: An unsuccessful telephone outreach was attempted today to offer the patient information about available care coordination services as a benefit of their health plan.   Follow Up Plan:  Additional outreach attempts will be made to offer the patient care coordination information and services.   Encounter Outcome:  No Answer  Julian Hy, [place] Direct Dial: [phone]

## 2022-07-06 ENCOUNTER — Ambulatory Visit: Payer: Self-pay | Admitting: Licensed Clinical Social Worker

## 2022-07-06 ENCOUNTER — Ambulatory Visit: Payer: Medicare Other | Attending: Cardiology | Admitting: Cardiology

## 2022-07-06 NOTE — Patient Outreach (Signed)
  Care Coordination  Initial Visit Note   07/06/2022 Name: Jill Armstrong MRN: [id number] DOB: [birth year]  Jill Armstrong is a 77 y.o. year old female who sees Debbrah Alar, NP for primary care. I spoke with  Jill Armstrong's daughter Jill Armstrong by phone today.  What matters to the patients health and wellness today?  Obtaining information on community support options related to memory loss. Discussed with daughter the importance of getting [place] in place.  Her main concern today was about patient's money and how to apply for Medicaid.   This information was shared as well as other things to consider as patient's memory continues to progress.  Suggested daughter seeks [employer] for other questions.   Goals Addressed             This Visit's Progress    COMPLETED: Provide Caregiver with information and resources       Activities and task to complete in order to accomplish goals.   Call or go to website at [place] if you decide to apply for Medicaid Call [facility] to follow up on getting additional questions answered about Medicaid         SDOH assessments and interventions completed:  Yes  SDOH Interventions Today    Flowsheet Row Most Recent Value  SDOH Interventions   Food Insecurity Interventions Intervention Not Indicated  Housing Interventions Intervention Not Indicated  Transportation Interventions Intervention Not Indicated  Financial Strain Interventions Intervention Not Indicated      Care Coordination Interventions:  Yes, provided  Interventions Today    Flowsheet Row Most Recent Value  Chronic Disease   Chronic disease during today's visit Hypertension (HTN)  General Interventions   General Interventions Discussed/Reviewed General Interventions Discussed, Level of Care  Level of Care Adult Daycare, Assisted Living, [place]  [provided information on in home and  facility placement options and process]  Applications Medicaid  [reviewed process, provided basic information and where to apply in order to obtain specific answers]  Education Interventions   Education Provided Provided Education  Provided Verbal Education On Location manager, [employer], [employer]  [reviewed process, provided basic information and where to apply in order to obtain specific answers]  Advanced Directive Interventions   Advanced Directives Discussed/Reviewed Advanced Directives Discussed, Guardianship  [Discussed and provided basic information and where to seek consulation for specific questions]       Follow up plan: No further intervention required. Daughter states all needed information was provided  Encounter Outcome:  Pt. Visit Completed   Casimer Lanius, [place] [phone]

## 2022-07-06 NOTE — Patient Instructions (Signed)
Visit Information  Thank you for taking time to visit with me today. Please don't hesitate to contact me if I can be of assistance to you.   Following are the goals we discussed today:   Goals Addressed             This Visit's Progress    COMPLETED: Provide Caregiver with information and resources       Activities and task to complete in order to accomplish goals.   Call or go to website at [place] if you decide to apply for Medicaid Call [employer] to follow up on getting additional questions answered about Medicaid          Please call the care guide team at [phone] if you need to cancel or reschedule your appointment.    The patient verbalized understanding of instructions, educational materials, and care plan provided today and DECLINED offer to receive copy of patient instructions, educational materials, and care plan.   No further follow up required: By [place], [place] [phone]

## 2022-07-10 ENCOUNTER — Telehealth: Payer: Self-pay | Admitting: Family

## 2022-07-10 NOTE — Telephone Encounter (Signed)
Patient's daughter called wanting her mom's thyroid labs to be sent to [facility] in [place], Debe Coder so they can get the labs done before the patient's appointment. Please advise.

## 2022-07-11 NOTE — Telephone Encounter (Signed)
Order faxed, patient's daughter notified.

## 2022-07-11 NOTE — Addendum Note (Signed)
Addended by: Jiles Prows on: 07/11/2022 11:38 AM   Modules accepted: Orders

## 2022-07-21 ENCOUNTER — Other Ambulatory Visit: Payer: Self-pay | Admitting: Family

## 2022-07-21 DIAGNOSIS — E039 Hypothyroidism, unspecified: Secondary | ICD-10-CM | POA: Diagnosis not present

## 2022-07-22 LAB — TSH: TSH: 6.95 u[IU]/mL — ABNORMAL HIGH (ref 0.450–4.500)

## 2022-07-24 ENCOUNTER — Telehealth: Payer: Self-pay | Admitting: Family

## 2022-07-24 DIAGNOSIS — E039 Hypothyroidism, unspecified: Secondary | ICD-10-CM

## 2022-07-24 MED ORDER — LEVOTHYROXINE SODIUM 112 MCG PO TABS: 112.0000 ug | ORAL_TABLET | Freq: Every day | ORAL | 0 refills | Status: DC

## 2022-07-24 NOTE — Telephone Encounter (Signed)
Please contact pt's daughter Marcelino Duster and let her know that patient's thyroid test is improving but we still need to raise her synthroid a little more. Please increase synthroid from to 112 mcg. Repeat TSH in 6 weeks.

## 2022-07-24 NOTE — Telephone Encounter (Signed)
Spoke to patient's daughter and advised of results and med dose change. She verbalized understanding, patient will be schedule for TSH when she comes in 08/04/22

## 2022-08-04 ENCOUNTER — Telehealth: Payer: Self-pay | Admitting: Family

## 2022-08-04 ENCOUNTER — Ambulatory Visit (INDEPENDENT_AMBULATORY_CARE_PROVIDER_SITE_OTHER): Payer: Medicare Other | Admitting: Family

## 2022-08-04 VITALS — BP 114/71 | HR 89 | Temp 98.1°F | Resp 18 | Ht 60.0 in | Wt 119.4 lb

## 2022-08-04 DIAGNOSIS — R413 Other amnesia: Secondary | ICD-10-CM | POA: Diagnosis not present

## 2022-08-04 DIAGNOSIS — M810 Age-related osteoporosis without current pathological fracture: Secondary | ICD-10-CM

## 2022-08-04 DIAGNOSIS — E039 Hypothyroidism, unspecified: Secondary | ICD-10-CM

## 2022-08-04 DIAGNOSIS — Z1231 Encounter for screening mammogram for malignant neoplasm of breast: Secondary | ICD-10-CM

## 2022-08-04 DIAGNOSIS — I10 Essential (primary) hypertension: Secondary | ICD-10-CM | POA: Diagnosis not present

## 2022-08-04 DIAGNOSIS — E785 Hyperlipidemia, unspecified: Secondary | ICD-10-CM | POA: Diagnosis not present

## 2022-08-04 DIAGNOSIS — M1990 Unspecified osteoarthritis, unspecified site: Secondary | ICD-10-CM | POA: Diagnosis not present

## 2022-08-04 MED ORDER — AMLODIPINE BESYLATE 5 MG PO TABS: 5.0000 mg | ORAL_TABLET | Freq: Every day | ORAL | 1 refills | Status: AC

## 2022-08-04 NOTE — Assessment & Plan Note (Signed)
She is scheduled at the memory center at [facility] in August.  She is now living with her daughter which I think is a great thing for her health and safety.

## 2022-08-04 NOTE — Assessment & Plan Note (Signed)
She is now agreeable to trying prolia. Will see if we can get it covered for her.

## 2022-08-04 NOTE — Telephone Encounter (Signed)
Patient would like to look into prolia shots. Kim could you please forward this to the prolia team for me?  I don't think I am doing it correctly.

## 2022-08-04 NOTE — Assessment & Plan Note (Signed)
Lab Results  Component Value Date   TSH 6.950 (H) 07/21/2022   Had not been taking her synthroid regularly last visit. Will repeat TSH and adjust synthroid as needed.

## 2022-08-04 NOTE — Assessment & Plan Note (Signed)
Lab Results  Component Value Date   HGBA1C 5.4 08/25/2020    

## 2022-08-04 NOTE — Progress Notes (Signed)
Subjective:   By signing my name below, I, Jill Armstrong, attest that this documentation has been prepared under the direction and in the presence of Jill Fillers, NP 08/04/22   Patient ID: Jill Armstrong, female    DOB: 08-Dec-1945, 77 y.o.   MRN: [id number]  Chief Complaint  Patient presents with   3 month follow up    FYI: pt now lives with her daughter.      HPI Patient is in today for a 3 month follow up. She is accompanied by her son. She recently moved in with her oldest daughter in [place] last weekend.   Hypertension: Her blood pressure was within normal limits today at 114/71. She is taking 5 mg Amlodipine once daily.  Hypothyroidism: Her thyroid levels were elevated at the time of her last visit. She has been taking Synthroid daily.  Lab Results  Component Value Date   TSH 6.950 (H) 07/21/2022   Arthritis: She reports occasional left hip pain. Otherwise, she has no new concerns.   Smoking: She has been smoking about half a pack a day. She is not interested in quitting.   Past Medical History:  Diagnosis Date   Arthritis    osteoarthritis right hip   Osteopenia    Thyroid disease    hypothyroidism   Tobacco abuse     Past Surgical History:  Procedure Laterality Date   EYE SURGERY     TONSILLECTOMY AND ADENOIDECTOMY     TOTAL HIP ARTHROPLASTY  04/10/2005   right hip   TUBAL LIGATION      Family History  Adopted: Yes  Family history unknown: Yes    Social History   Socioeconomic History   Marital status: Divorced    Spouse name: Not on file   Number of children: 4   Years of education: Not on file   Highest education level: Not on file  Occupational History   Not on file  Tobacco Use   Smoking status: Every Day    Packs/day: 0.75    Years: 45.00    Additional pack years: 0.00    Total pack years: 33.75    Types: Cigarettes   Smokeless tobacco: Never  Substance and Sexual Activity   Alcohol use: Yes    Comment: couple of times per  week   Drug use: No   Sexual activity: Never  Other Topics Concern   Not on file  Social History Narrative   Caffeine Use:  2-6 cups daily   Regular exercise:  No   4 children, one in [place], 1 in [place], 1 in [place], 1 in [place]   1 grandchildren   Retired from department store.    Social Determinants of Health   Financial Resource Strain: Low Risk  (07/06/2022)   Overall Financial Resource Strain (CARDIA)    Difficulty of Paying Living Expenses: Not hard at all  Food Insecurity: No Food Insecurity (07/06/2022)   Hunger Vital Sign    Worried About Running Out of Food in the Last Year: Never true    Ran Out of Food in the Last Year: Never true  Transportation Needs: No Transportation Needs (07/06/2022)   PRAPARE - Administrator, Civil Service (Medical): No    Lack of Transportation (Non-Medical): No  Physical Activity: Inactive (01/24/2022)   Exercise Vital Sign    Days of Exercise per Week: 0 days    Minutes of Exercise per Session: 0 min  Stress: No Stress Concern  Present (01/24/2022)   [employer] of Occupational Health - Occupational Stress Questionnaire    Feeling of Stress : Not at all  Social Connections: Socially Isolated (01/24/2022)   Social Connection and Isolation Panel [NHANES]    Frequency of Communication with Friends and Family: More than three times a week    Frequency of Social Gatherings with Friends and Family: More than three times a week    Attends Religious Services: Never    Database administrator or Organizations: No    Attends Banker Meetings: Never    Marital Status: Divorced  Catering manager Violence: Not At Risk (01/24/2022)   Humiliation, Afraid, Rape, and Kick questionnaire    Fear of Current or Ex-Partner: No    Emotionally Abused: No    Physically Abused: No    Sexually Abused: No    Outpatient Medications Prior to Visit  Medication Sig Dispense Refill   atorvastatin (LIPITOR) 10 MG tablet Take 1  tablet (10 mg total) by mouth daily. 90 tablet 3   Calcium Carbonate-Vitamin D 600-400 MG-UNIT tablet Take 1 tablet by mouth 2 (two) times daily. Reported on 07/13/2015     cyanocobalamin (VITAMIN B12) 1000 MCG tablet Take 1 tablet (1,000 mcg total) by mouth daily.     levothyroxine (SYNTHROID) 112 MCG tablet Take 1 tablet (112 mcg total) by mouth daily. 90 tablet 0   Multiple Vitamins-Minerals (MULTIVITAMIN WITH MINERALS) tablet Take 1 tablet by mouth daily.     Zoster Vaccine Adjuvanted [facility]) injection Inject 0.5mg  IM now and again in 2-6 months. 0.5 mL 1   amLODipine (NORVASC) 5 MG tablet Take 1 tablet by mouth once daily 90 tablet 0   No facility-administered medications prior to visit.    Allergies  Allergen Reactions   Fosamax [Alendronate Sodium] Nausea And Vomiting    Review of Systems  Musculoskeletal:  Positive for joint pain (occasional left hip).       Objective:    Physical Exam Constitutional:      General: She is not in acute distress.    Appearance: Normal appearance. She is well-developed.  HENT:     Head: Normocephalic and atraumatic.     Right Ear: External ear normal.     Left Ear: External ear normal.  Eyes:     General: No scleral icterus. Neck:     Thyroid: No thyromegaly.  Cardiovascular:     Rate and Rhythm: Normal rate and regular rhythm.     Heart sounds: Normal heart sounds. No murmur heard. Pulmonary:     Effort: Pulmonary effort is normal. No respiratory distress.     Breath sounds: Normal breath sounds. No wheezing.  Musculoskeletal:     Cervical back: Neck supple.  Skin:    General: Skin is warm and dry.  Neurological:     Mental Status: She is alert and oriented to person, place, and time.  Psychiatric:        Mood and Affect: Mood normal.        Behavior: Behavior normal.        Thought Content: Thought content normal.        Judgment: Judgment normal.     BP 114/71 (BP Location: Left Arm, Patient Position: Sitting, Cuff  Size: Normal)   Pulse 89   Temp 98.1 F (36.7 C) (Oral)   Resp 18   Ht 5' (1.524 m)   Wt 119 lb 6.4 oz (54.2 kg)   SpO2 99%   BMI 23.32  kg/m  Wt Readings from Last 3 Encounters:  08/04/22 119 lb 6.4 oz (54.2 kg)  05/02/22 118 lb (53.5 kg)  10/07/21 120 lb (54.4 kg)       Assessment & Plan:  Hypothyroidism, unspecified type Assessment & Plan: Lab Results  Component Value Date   TSH 6.950 (H) 07/21/2022   Had not been taking her synthroid regularly last visit. Will repeat TSH and adjust synthroid as needed.   Orders: -     TSH  Essential hypertension Assessment & Plan: BP Readings from Last 3 Encounters:  08/04/22 114/71  05/02/22 127/83  10/07/21 (!) 132/56   Stable on amlodipine 5mg  once daily.   Orders: -     Comprehensive metabolic panel  Hyperlipidemia, unspecified hyperlipidemia type Assessment & Plan: Lab Results  Component Value Date   CHOL 230 (H) 05/02/2022   HDL 112.90 05/02/2022   LDLCALC 101 (H) 05/02/2022   TRIG 81.0 05/02/2022   CHOLHDL 2 05/02/2022   Was a bit high, repeat today. Continue lipitor.  Orders: -     Lipid panel  Osteoarthritis, unspecified osteoarthritis type, unspecified site Assessment & Plan: Occasional left hip pain.     Breast cancer screening by mammogram -     3D Screening Mammogram, Left and Right; Future  Osteoporosis, unspecified osteoporosis type, unspecified pathological fracture presence Assessment & Plan: She is now agreeable to trying prolia. Will see if we can get it covered for her.    Memory loss Assessment & Plan: She is scheduled at the memory center at [facility] in August.  She is now living with her daughter which I think is a great thing for her health and safety.   Other orders -     amLODIPine Besylate; Take 1 tablet (5 mg total) by mouth daily.  Dispense: 90 tablet; Refill: 1     I,Rachel Rivera,acting as a scribe for Jill Fillers, NP.,have documented all relevant  documentation on the behalf of Jill Fillers, NP,as directed by  Jill Fillers, NP while in the presence of Jill Fillers, NP.   I, Jill Fillers, NP, personally preformed the services described in this documentation.  All medical record entries made by the scribe were at my direction and in my presence.  I have reviewed the chart and discharge instructions (if applicable) and agree that the record reflects my personal performance and is accurate and complete. 08/04/22   Jill Fillers, NP

## 2022-08-04 NOTE — Assessment & Plan Note (Signed)
Occasional left hip pain.

## 2022-08-04 NOTE — Assessment & Plan Note (Signed)
Lab Results  Component Value Date   CHOL 230 (H) 05/02/2022   HDL 112.90 05/02/2022   LDLCALC 101 (H) 05/02/2022   TRIG 81.0 05/02/2022   CHOLHDL 2 05/02/2022   Was a bit high, repeat today. Continue lipitor.

## 2022-08-04 NOTE — Assessment & Plan Note (Signed)
BP Readings from Last 3 Encounters:  08/04/22 114/71  05/02/22 127/83  10/07/21 (!) 132/56   Stable on amlodipine 5mg  once daily.

## 2022-08-07 ENCOUNTER — Telehealth: Payer: Self-pay

## 2022-08-07 NOTE — Telephone Encounter (Signed)
New encounter created for Prolia BIV. Will route back to pool once benefits have been verified.

## 2022-08-07 NOTE — Telephone Encounter (Signed)
Can you all run her benefits for Prolia. She has tried Fosamax.

## 2022-08-07 NOTE — Telephone Encounter (Signed)
Prolia VOB initiated via [email or web address] 

## 2022-08-10 ENCOUNTER — Other Ambulatory Visit (HOSPITAL_COMMUNITY): Payer: Self-pay

## 2022-08-10 NOTE — Telephone Encounter (Signed)
Please advise daughter that insurance won't pay for her prolia.  They say she had to fail fosamax and IV Reclast (same type of drug as fosamax).  She had nausea/vomiting with Reclast.  I am hesitant to give her reclast because of her response to Fosamax.  She can purchase prolia at the pharmacy for $300 without insurance.  It is given once every 6 months. Is that a possibility for her?

## 2022-08-10 NOTE — Telephone Encounter (Signed)
[employer] requires failure of both oral and IV bisphosphate for new to therapy with Prolia. No documentation of an IV bisphosphate. Patient can fill Prolia prescription at retail pharmacy without PA. Pharmacy benefit copay: $300     **This test claim was processed through [facility]- copay amounts may vary at other pharmacies due to pharmacy/plan contracts, or as the patient moves through the different stages of their insurance plan.**

## 2022-08-10 NOTE — Telephone Encounter (Signed)
Please see below and advise.

## 2022-08-14 NOTE — Telephone Encounter (Signed)
Spoke to patient's daughter Marcelino Duster  and she was given this information. She will contact us if she decides to have this done. Patient had lab orders from last visit, this was not collected. Orders changed to future [facility] collect and she will have this done at lab in her area.

## 2022-08-14 NOTE — Addendum Note (Signed)
Addended by: Wilford Corner on: 08/14/2022 03:24 PM   Modules accepted: Orders

## 2022-09-01 ENCOUNTER — Telehealth: Payer: Self-pay | Admitting: Family

## 2022-09-01 NOTE — Telephone Encounter (Signed)
Jill Armstrong (daughter DPR ok) called stating that she needed to have her TSH lab rerouted to the [facility] in [place] in [place], [place].   She also stated pt is not currently taking any medication other than her thyroid and BP medication.

## 2022-09-05 NOTE — Addendum Note (Signed)
Addended by: Wilford Corner on: 09/05/2022 11:20 AM   Modules accepted: Orders

## 2022-09-05 NOTE — Telephone Encounter (Signed)
Orders mailed to patient's daughter

## 2022-10-20 ENCOUNTER — Other Ambulatory Visit: Payer: Self-pay | Admitting: Family

## 2022-10-20 DIAGNOSIS — I10 Essential (primary) hypertension: Secondary | ICD-10-CM | POA: Diagnosis not present

## 2022-10-20 DIAGNOSIS — E785 Hyperlipidemia, unspecified: Secondary | ICD-10-CM | POA: Diagnosis not present

## 2022-10-20 DIAGNOSIS — E039 Hypothyroidism, unspecified: Secondary | ICD-10-CM | POA: Diagnosis not present

## 2022-10-21 LAB — COMPREHENSIVE METABOLIC PANEL
ALT: 14 IU/L (ref 0–32)
AST: 19 IU/L (ref 0–40)
Albumin: 4.4 g/dL (ref 3.8–4.8)
Alkaline Phosphatase: 80 IU/L (ref 44–121)
BUN/Creatinine Ratio: 15 (ref 12–28)
BUN: 12 mg/dL (ref 8–27)
Bilirubin Total: 1.2 mg/dL (ref 0.0–1.2)
CO2: 19 mmol/L — ABNORMAL LOW (ref 20–29)
Calcium: 10 mg/dL (ref 8.7–10.3)
Chloride: 101 mmol/L (ref 96–106)
Creatinine, Ser: 0.82 mg/dL (ref 0.57–1.00)
Globulin, Total: 2.6 g/dL (ref 1.5–4.5)
Glucose: 103 mg/dL — ABNORMAL HIGH (ref 70–99)
Potassium: 4.5 mmol/L (ref 3.5–5.2)
Sodium: 139 mmol/L (ref 134–144)
Total Protein: 7 g/dL (ref 6.0–8.5)
eGFR: 73 mL/min/{1.73_m2} (ref >59)

## 2022-10-21 LAB — LIPID PANEL W/O CHOL/HDL RATIO
Cholesterol, Total: 194 mg/dL (ref 100–199)
HDL: 102 mg/dL (ref >39)
LDL Chol Calc (NIH): 78 mg/dL (ref 0–99)
Triglycerides: 79 mg/dL (ref 0–149)
VLDL Cholesterol Cal: 14 mg/dL (ref 5–40)

## 2022-10-21 LAB — SPECIMEN STATUS REPORT

## 2022-10-21 LAB — TSH: TSH: 0.008 u[IU]/mL — ABNORMAL LOW (ref 0.450–4.500)

## 2022-10-24 ENCOUNTER — Telehealth: Payer: Self-pay | Admitting: Family

## 2022-10-24 DIAGNOSIS — E039 Hypothyroidism, unspecified: Secondary | ICD-10-CM

## 2022-10-24 MED ORDER — LEVOTHYROXINE SODIUM 88 MCG PO TABS: 88.0000 ug | ORAL_TABLET | Freq: Every day | ORAL | 0 refills | Status: DC

## 2022-10-24 NOTE — Telephone Encounter (Signed)
Please contact daughter and let her know that now that her mother is taking her synthroid regularly, it looks too strong.  I actually wonder if she may be taking more than one tablet daily based on her levels.    Please decrease synthroid to 88 mcg and repeat tsh in 6 weeks.  I think they like to do the TSH near home? You can check with daughter.

## 2022-10-25 ENCOUNTER — Other Ambulatory Visit: Payer: Self-pay

## 2022-10-25 DIAGNOSIS — E039 Hypothyroidism, unspecified: Secondary | ICD-10-CM

## 2022-10-25 MED ORDER — LEVOTHYROXINE SODIUM 88 MCG PO TABS: 88.0000 ug | ORAL_TABLET | Freq: Every day | ORAL | 0 refills | Status: DC

## 2022-10-25 NOTE — Telephone Encounter (Signed)
Spoke to patient's daughter Jill Armstrong ad advised of results. She reports that she provides the medication for her mother and not taking any extra medication. She was advised of decrease in dosage and order for labs will be mailed to her home address to be done in about 6 weeks.

## 2022-10-26 ENCOUNTER — Other Ambulatory Visit: Payer: Self-pay

## 2022-10-26 ENCOUNTER — Telehealth: Payer: Self-pay

## 2022-10-26 DIAGNOSIS — E039 Hypothyroidism, unspecified: Secondary | ICD-10-CM

## 2022-10-26 MED ORDER — LEVOTHYROXINE SODIUM 88 MCG PO TABS: 88.0000 ug | ORAL_TABLET | Freq: Every day | ORAL | 0 refills | Status: AC

## 2022-10-26 NOTE — Telephone Encounter (Signed)
Patient's daughter Marcelino Duster called to advise that the Levothyroxine 88 mcg was sent to the wrong pharmacy. Patient has moved to [facility] with daughter. Please send to [facility] [place] and delete the [employer]. Marcelino Duster said she did not give her mom medication this morning because it's the wrong dose.

## 2022-10-26 NOTE — Telephone Encounter (Signed)
Rx sent to pharmacy in [place]

## 2022-10-26 NOTE — Telephone Encounter (Signed)
Called pharmacy to cancel rx for levothyroxine prescription was sent to wrong pharmacy. Patient daughter was informed per [facility] front staff.

## 2022-11-25 IMAGING — MG MM DIGITAL SCREENING BILAT W/ TOMO AND CAD
8 series · 9 of 24 positions shown · non-contrast
Comparison: Previous exam(s).

CLINICAL DATA: Screening.

EXAM:
DIGITAL SCREENING BILATERAL MAMMOGRAM WITH TOMOSYNTHESIS AND CAD
TECHNIQUE: Bilateral screening digital craniocaudal and mediolateral oblique
mammograms were obtained. Bilateral screening digital breast
tomosynthesis was performed. The images were evaluated with
computer-aided detection.

[R MLO synth-2D]
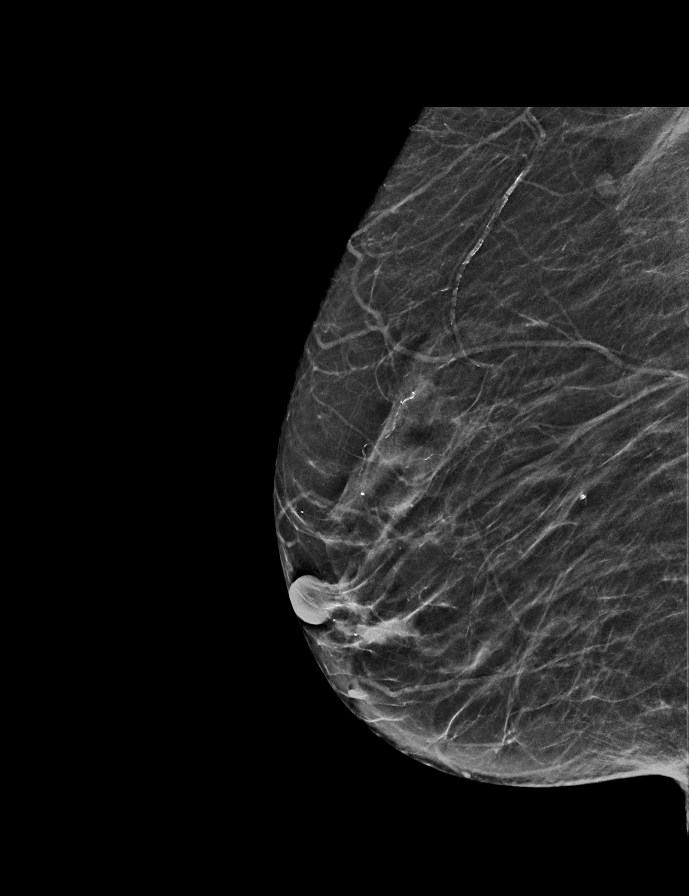

[R CC synth-2D]
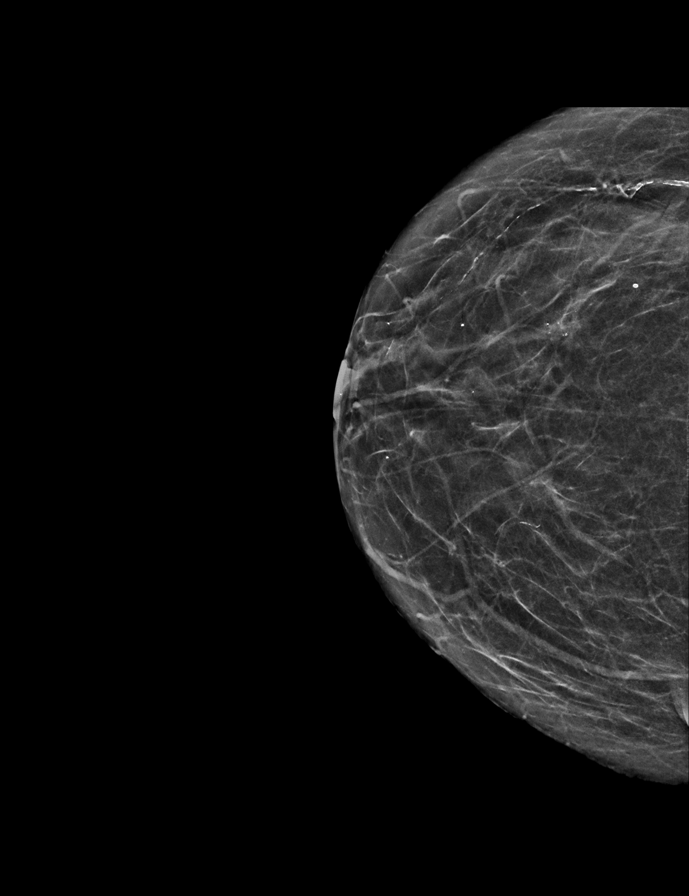

[L CC synth-2D]
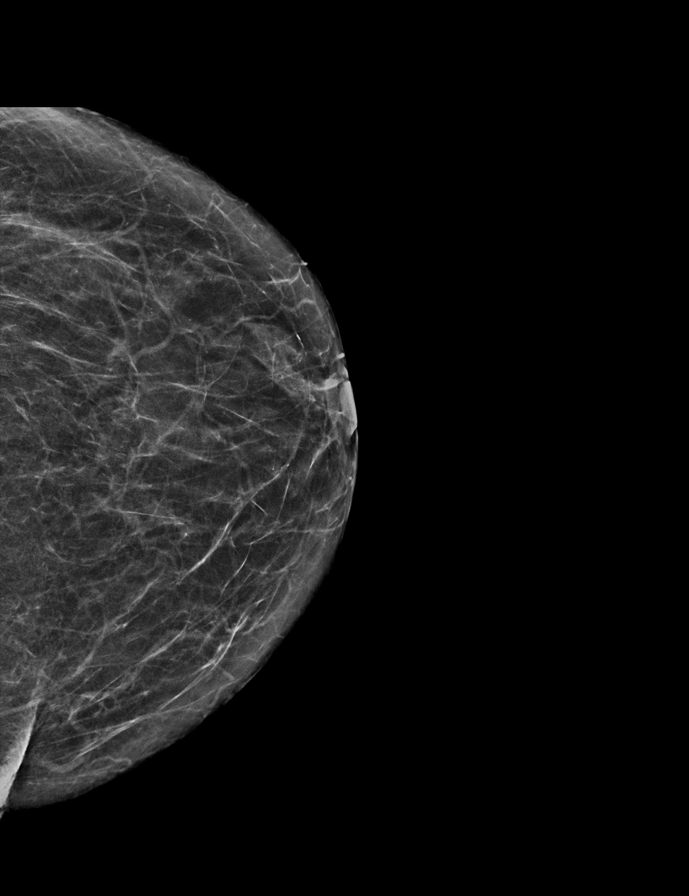

[L MLO synth-2D]
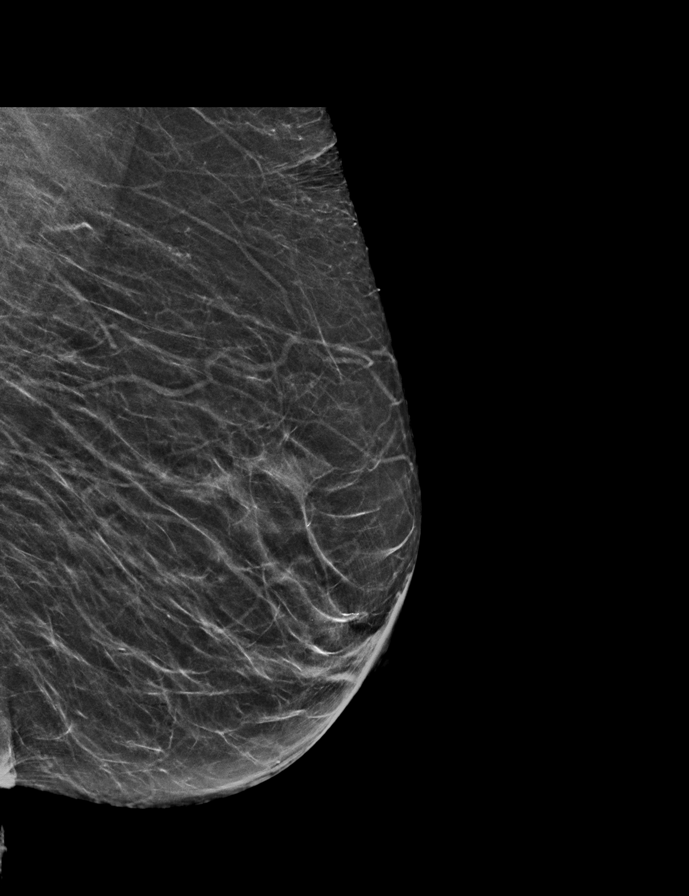

[L CC tomo · 2 of 46 frames shown]
[frame 15/46]
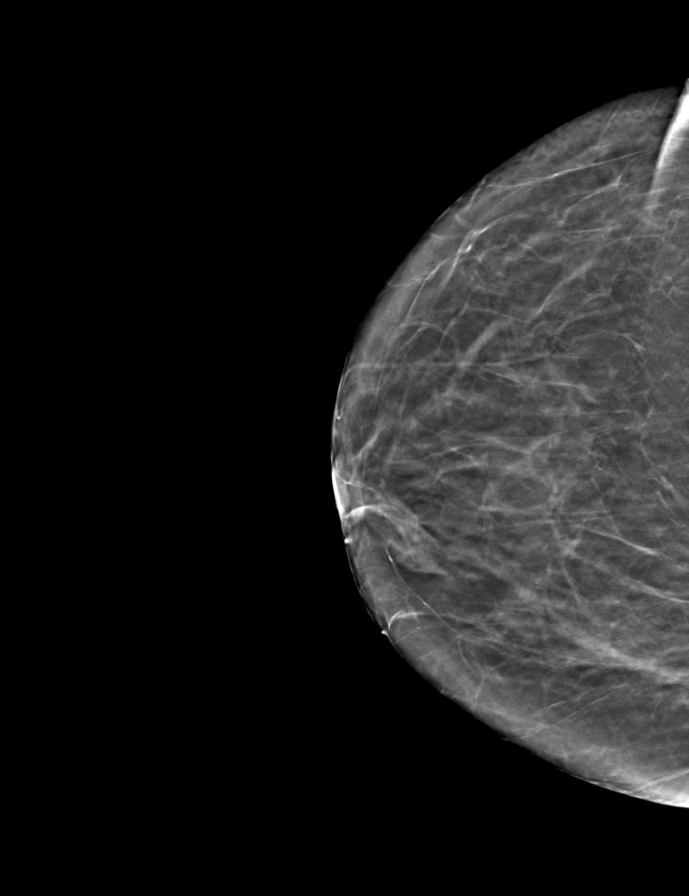
[frame 23/46]
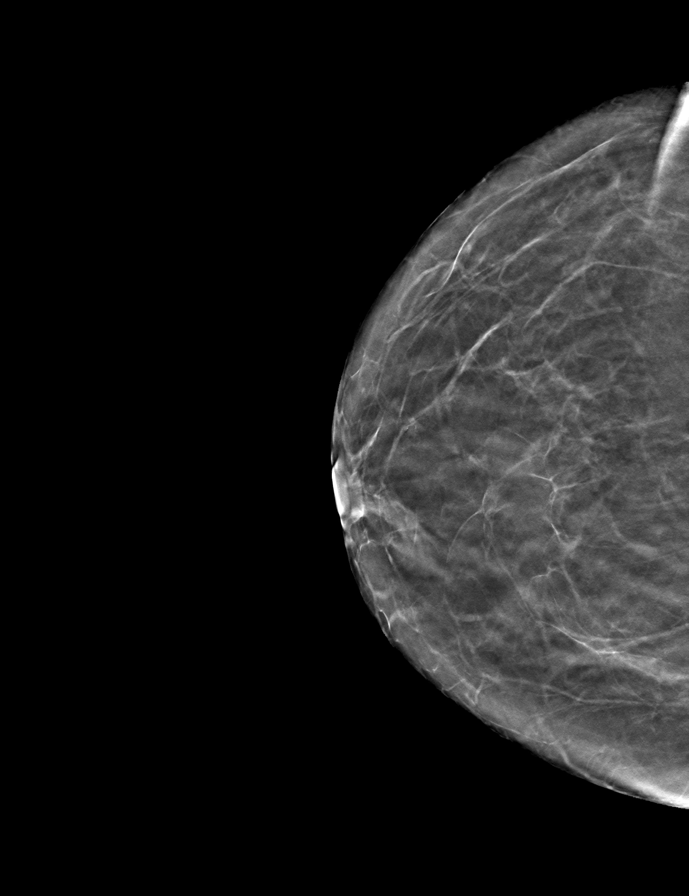

[L MLO tomo · tomo slice 24/47.0]
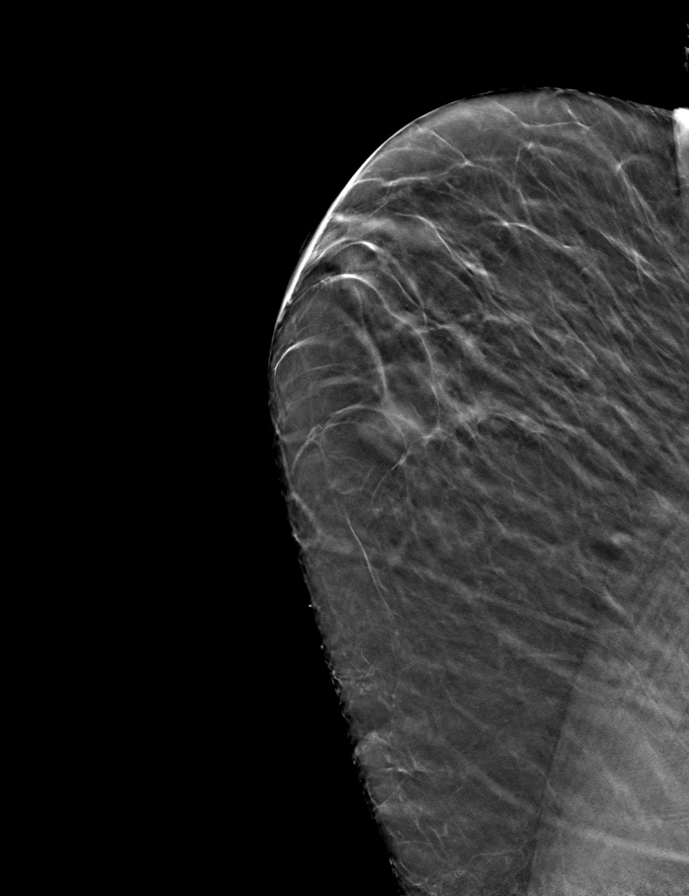

[R CC tomo · tomo slice 23/46.0]
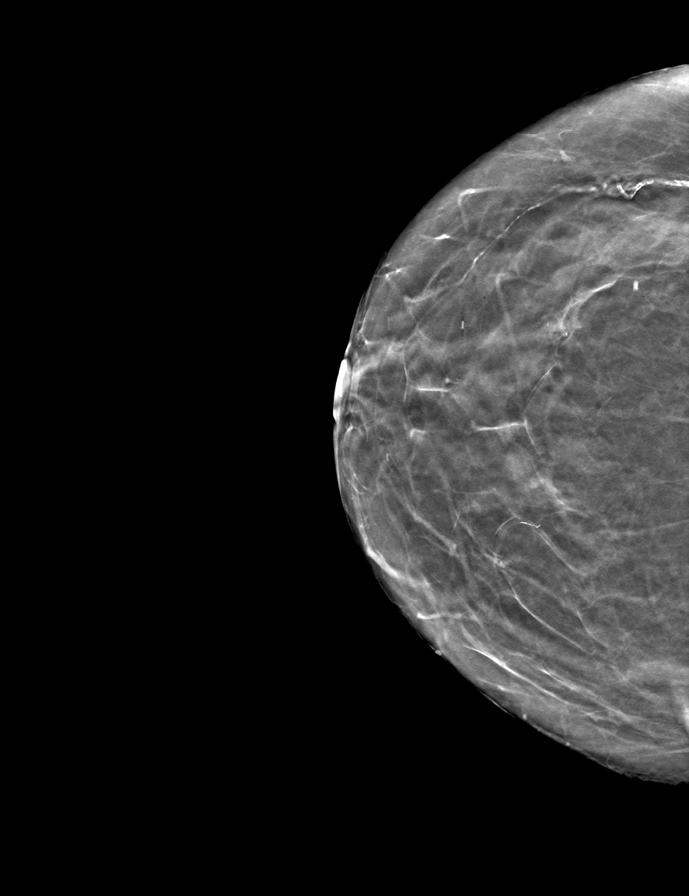

[R MLO tomo · tomo slice 24/47.0]
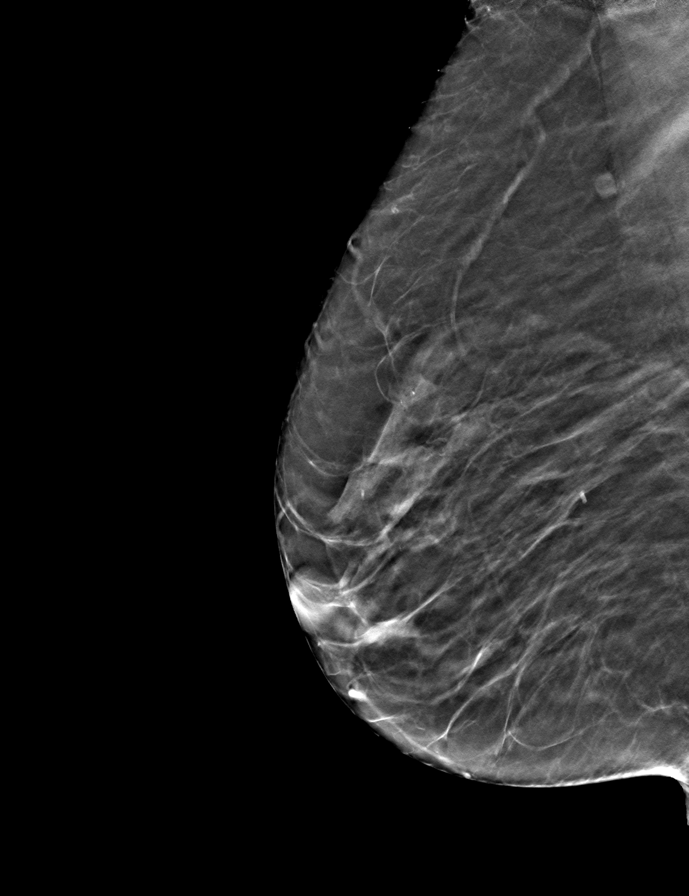

[9 of 24 positions shown; findings below may reference images not displayed]

ACR Breast Density Category b: There are scattered areas of
fibroglandular density.
FINDINGS: There are no findings suspicious for malignancy.
IMPRESSION: No mammographic evidence of malignancy. A result letter of this
screening mammogram will be mailed directly to the patient.

RECOMMENDATION:
Screening mammogram in one year. (Code:51-O-LD2)

BI-RADS CATEGORY  1: Negative.

## 2022-12-22 LAB — HM MAMMOGRAPHY

## 2023-02-16 DIAGNOSIS — B379 Candidiasis, unspecified: Secondary | ICD-10-CM | POA: Diagnosis not present

## 2023-02-16 DIAGNOSIS — E039 Hypothyroidism, unspecified: Secondary | ICD-10-CM | POA: Diagnosis not present

## 2023-02-16 DIAGNOSIS — I10 Essential (primary) hypertension: Secondary | ICD-10-CM | POA: Diagnosis not present

## 2023-02-16 DIAGNOSIS — M25552 Pain in left hip: Secondary | ICD-10-CM | POA: Diagnosis not present

## 2023-04-27 DIAGNOSIS — E785 Hyperlipidemia, unspecified: Secondary | ICD-10-CM | POA: Diagnosis not present

## 2023-04-27 DIAGNOSIS — M810 Age-related osteoporosis without current pathological fracture: Secondary | ICD-10-CM | POA: Diagnosis not present

## 2023-04-27 DIAGNOSIS — J449 Chronic obstructive pulmonary disease, unspecified: Secondary | ICD-10-CM | POA: Diagnosis not present

## 2023-04-27 DIAGNOSIS — I700 Atherosclerosis of aorta: Secondary | ICD-10-CM | POA: Diagnosis not present

## 2023-04-27 DIAGNOSIS — E039 Hypothyroidism, unspecified: Secondary | ICD-10-CM | POA: Diagnosis not present

## 2023-04-27 DIAGNOSIS — F17220 Nicotine dependence, chewing tobacco, uncomplicated: Secondary | ICD-10-CM | POA: Diagnosis not present

## 2023-04-27 DIAGNOSIS — R413 Other amnesia: Secondary | ICD-10-CM | POA: Diagnosis not present

## 2023-04-27 DIAGNOSIS — M25552 Pain in left hip: Secondary | ICD-10-CM | POA: Diagnosis not present

## 2023-04-27 DIAGNOSIS — Z6821 Body mass index (BMI) 21.0-21.9, adult: Secondary | ICD-10-CM | POA: Diagnosis not present

## 2023-05-08 DIAGNOSIS — M25552 Pain in left hip: Secondary | ICD-10-CM | POA: Diagnosis not present

## 2023-05-08 DIAGNOSIS — Z96641 Presence of right artificial hip joint: Secondary | ICD-10-CM | POA: Diagnosis not present

## 2023-05-08 DIAGNOSIS — R2681 Unsteadiness on feet: Secondary | ICD-10-CM | POA: Diagnosis not present

## 2023-06-01 DIAGNOSIS — I700 Atherosclerosis of aorta: Secondary | ICD-10-CM | POA: Diagnosis not present

## 2023-06-01 DIAGNOSIS — Z6821 Body mass index (BMI) 21.0-21.9, adult: Secondary | ICD-10-CM | POA: Diagnosis not present

## 2023-06-01 DIAGNOSIS — F17200 Nicotine dependence, unspecified, uncomplicated: Secondary | ICD-10-CM | POA: Diagnosis not present

## 2023-06-01 DIAGNOSIS — R413 Other amnesia: Secondary | ICD-10-CM | POA: Diagnosis not present

## 2023-06-01 DIAGNOSIS — K760 Fatty (change of) liver, not elsewhere classified: Secondary | ICD-10-CM | POA: Diagnosis not present

## 2023-06-01 DIAGNOSIS — M25552 Pain in left hip: Secondary | ICD-10-CM | POA: Diagnosis not present

## 2023-06-01 DIAGNOSIS — E039 Hypothyroidism, unspecified: Secondary | ICD-10-CM | POA: Diagnosis not present

## 2023-06-01 DIAGNOSIS — J449 Chronic obstructive pulmonary disease, unspecified: Secondary | ICD-10-CM | POA: Diagnosis not present

## 2023-06-01 DIAGNOSIS — M810 Age-related osteoporosis without current pathological fracture: Secondary | ICD-10-CM | POA: Diagnosis not present

## 2023-06-01 DIAGNOSIS — E785 Hyperlipidemia, unspecified: Secondary | ICD-10-CM | POA: Diagnosis not present

## 2023-06-01 DIAGNOSIS — J439 Emphysema, unspecified: Secondary | ICD-10-CM | POA: Diagnosis not present

## 2023-06-08 DIAGNOSIS — E1169 Type 2 diabetes mellitus with other specified complication: Secondary | ICD-10-CM | POA: Diagnosis not present

## 2023-06-08 DIAGNOSIS — E559 Vitamin D deficiency, unspecified: Secondary | ICD-10-CM | POA: Diagnosis not present

## 2023-06-08 DIAGNOSIS — E782 Mixed hyperlipidemia: Secondary | ICD-10-CM | POA: Diagnosis not present

## 2023-06-08 DIAGNOSIS — M1612 Unilateral primary osteoarthritis, left hip: Secondary | ICD-10-CM | POA: Diagnosis not present

## 2023-06-08 DIAGNOSIS — M25552 Pain in left hip: Secondary | ICD-10-CM | POA: Diagnosis not present

## 2023-06-08 DIAGNOSIS — Z79899 Other long term (current) drug therapy: Secondary | ICD-10-CM | POA: Diagnosis not present

## 2023-06-08 DIAGNOSIS — M810 Age-related osteoporosis without current pathological fracture: Secondary | ICD-10-CM | POA: Diagnosis not present

## 2023-06-08 DIAGNOSIS — D649 Anemia, unspecified: Secondary | ICD-10-CM | POA: Diagnosis not present

## 2023-08-02 ENCOUNTER — Telehealth: Payer: Self-pay

## 2023-08-02 NOTE — Telephone Encounter (Signed)
 Called [place] at [facility] pulmonology and advised her to call [facility] to obtain images of ct requested. Phone number provided  Copied from CRM [phone]. Topic: Medical Record Request - Provider/Facility Request >> Aug 01, 2023  9:37 AM Carlatta H wrote: Reason for CRM: Needs CT Scan from 06/01/22 via powershare or disk via mail// [address] [place] [address] [phone] ask for [facility]

## 2023-08-17 DIAGNOSIS — R911 Solitary pulmonary nodule: Secondary | ICD-10-CM | POA: Diagnosis not present

## 2023-08-17 DIAGNOSIS — R9431 Abnormal electrocardiogram [ECG] [EKG]: Secondary | ICD-10-CM | POA: Diagnosis not present

## 2023-08-17 DIAGNOSIS — Z122 Encounter for screening for malignant neoplasm of respiratory organs: Secondary | ICD-10-CM | POA: Diagnosis not present

## 2023-08-17 DIAGNOSIS — Z01818 Encounter for other preprocedural examination: Secondary | ICD-10-CM | POA: Diagnosis not present

## 2023-08-17 DIAGNOSIS — J432 Centrilobular emphysema: Secondary | ICD-10-CM | POA: Diagnosis not present

## 2023-08-17 DIAGNOSIS — Z01810 Encounter for preprocedural cardiovascular examination: Secondary | ICD-10-CM | POA: Diagnosis not present

## 2023-08-17 DIAGNOSIS — F17200 Nicotine dependence, unspecified, uncomplicated: Secondary | ICD-10-CM | POA: Diagnosis not present

## 2023-08-17 DIAGNOSIS — F17210 Nicotine dependence, cigarettes, uncomplicated: Secondary | ICD-10-CM | POA: Diagnosis not present

## 2023-08-17 DIAGNOSIS — E785 Hyperlipidemia, unspecified: Secondary | ICD-10-CM | POA: Diagnosis not present

## 2023-08-17 DIAGNOSIS — I10 Essential (primary) hypertension: Secondary | ICD-10-CM | POA: Diagnosis not present

## 2023-08-17 DIAGNOSIS — Z720 Tobacco use: Secondary | ICD-10-CM | POA: Diagnosis not present

## 2023-08-17 DIAGNOSIS — E039 Hypothyroidism, unspecified: Secondary | ICD-10-CM | POA: Diagnosis not present

## 2023-08-17 DIAGNOSIS — I2510 Atherosclerotic heart disease of native coronary artery without angina pectoris: Secondary | ICD-10-CM | POA: Diagnosis not present

## 2023-09-04 DIAGNOSIS — J432 Centrilobular emphysema: Secondary | ICD-10-CM | POA: Diagnosis not present

## 2023-09-04 DIAGNOSIS — J439 Emphysema, unspecified: Secondary | ICD-10-CM | POA: Diagnosis not present

## 2023-09-04 DIAGNOSIS — Z01811 Encounter for preprocedural respiratory examination: Secondary | ICD-10-CM | POA: Diagnosis not present

## 2023-09-04 DIAGNOSIS — J449 Chronic obstructive pulmonary disease, unspecified: Secondary | ICD-10-CM | POA: Diagnosis not present

## 2023-09-04 DIAGNOSIS — R0609 Other forms of dyspnea: Secondary | ICD-10-CM | POA: Diagnosis not present

## 2023-10-05 DIAGNOSIS — I2510 Atherosclerotic heart disease of native coronary artery without angina pectoris: Secondary | ICD-10-CM | POA: Diagnosis not present

## 2023-10-05 DIAGNOSIS — I10 Essential (primary) hypertension: Secondary | ICD-10-CM | POA: Diagnosis not present

## 2023-10-05 DIAGNOSIS — Z01818 Encounter for other preprocedural examination: Secondary | ICD-10-CM | POA: Diagnosis not present

## 2023-10-05 DIAGNOSIS — Z01810 Encounter for preprocedural cardiovascular examination: Secondary | ICD-10-CM | POA: Diagnosis not present

## 2023-10-05 DIAGNOSIS — Z720 Tobacco use: Secondary | ICD-10-CM | POA: Diagnosis not present

## 2023-10-05 DIAGNOSIS — I272 Pulmonary hypertension, unspecified: Secondary | ICD-10-CM | POA: Diagnosis not present

## 2023-10-05 DIAGNOSIS — I088 Other rheumatic multiple valve diseases: Secondary | ICD-10-CM | POA: Diagnosis not present

## 2023-10-24 DIAGNOSIS — Z720 Tobacco use: Secondary | ICD-10-CM | POA: Diagnosis not present

## 2023-10-24 DIAGNOSIS — Z01818 Encounter for other preprocedural examination: Secondary | ICD-10-CM | POA: Diagnosis not present

## 2023-10-24 DIAGNOSIS — I10 Essential (primary) hypertension: Secondary | ICD-10-CM | POA: Diagnosis not present

## 2023-10-24 DIAGNOSIS — I2510 Atherosclerotic heart disease of native coronary artery without angina pectoris: Secondary | ICD-10-CM | POA: Diagnosis not present

## 2024-01-14 DIAGNOSIS — E785 Hyperlipidemia, unspecified: Secondary | ICD-10-CM | POA: Diagnosis not present

## 2024-01-14 DIAGNOSIS — R413 Other amnesia: Secondary | ICD-10-CM | POA: Diagnosis not present

## 2024-01-14 DIAGNOSIS — I700 Atherosclerosis of aorta: Secondary | ICD-10-CM | POA: Diagnosis not present

## 2024-01-14 DIAGNOSIS — E559 Vitamin D deficiency, unspecified: Secondary | ICD-10-CM | POA: Diagnosis not present

## 2024-01-14 DIAGNOSIS — E039 Hypothyroidism, unspecified: Secondary | ICD-10-CM | POA: Diagnosis not present

## 2024-01-14 DIAGNOSIS — F17210 Nicotine dependence, cigarettes, uncomplicated: Secondary | ICD-10-CM | POA: Diagnosis not present

## 2024-01-14 DIAGNOSIS — K760 Fatty (change of) liver, not elsewhere classified: Secondary | ICD-10-CM | POA: Diagnosis not present

## 2024-01-14 DIAGNOSIS — M810 Age-related osteoporosis without current pathological fracture: Secondary | ICD-10-CM | POA: Diagnosis not present

## 2024-01-14 DIAGNOSIS — J432 Centrilobular emphysema: Secondary | ICD-10-CM | POA: Diagnosis not present

## 2024-01-14 DIAGNOSIS — I2510 Atherosclerotic heart disease of native coronary artery without angina pectoris: Secondary | ICD-10-CM | POA: Diagnosis not present

## 2024-01-14 DIAGNOSIS — I10 Essential (primary) hypertension: Secondary | ICD-10-CM | POA: Diagnosis not present

## 2024-01-25 DIAGNOSIS — M810 Age-related osteoporosis without current pathological fracture: Secondary | ICD-10-CM | POA: Diagnosis not present

## 2024-01-25 DIAGNOSIS — D649 Anemia, unspecified: Secondary | ICD-10-CM | POA: Diagnosis not present

## 2024-01-25 DIAGNOSIS — E039 Hypothyroidism, unspecified: Secondary | ICD-10-CM | POA: Diagnosis not present

## 2024-01-25 DIAGNOSIS — E559 Vitamin D deficiency, unspecified: Secondary | ICD-10-CM | POA: Diagnosis not present
